# Patient Record
Sex: Male | Born: 1980 | Race: White | Hispanic: No | Marital: Single | Smoking: Current every day smoker
Health system: Southern US, Community
[De-identification: ages and names within clinical notes are randomized; demographics above are authoritative.]

## PROBLEM LIST (undated history)

## (undated) DIAGNOSIS — N200 Calculus of kidney: Secondary | ICD-10-CM

## (undated) DIAGNOSIS — R569 Unspecified convulsions: Secondary | ICD-10-CM

## (undated) DIAGNOSIS — F4310 Post-traumatic stress disorder, unspecified: Secondary | ICD-10-CM

## (undated) HISTORY — PX: TONSILLECTOMY: SUR1361

---

## 2022-01-31 ENCOUNTER — Emergency Department (HOSPITAL_COMMUNITY)
Admission: EM | Admit: 2022-01-31 | Discharge: 2022-01-31 | Disposition: A | Discharge status: Home / Self Care | Payer: No Typology Code available for payment source | Attending: Emergency Medicine | Admitting: Emergency Medicine

## 2022-01-31 ENCOUNTER — Emergency Department (HOSPITAL_COMMUNITY): Payer: No Typology Code available for payment source

## 2022-01-31 ENCOUNTER — Encounter (HOSPITAL_COMMUNITY): Payer: Self-pay | Admitting: Emergency Medicine

## 2022-01-31 DIAGNOSIS — D72829 Elevated white blood cell count, unspecified: Secondary | ICD-10-CM | POA: Insufficient documentation

## 2022-01-31 DIAGNOSIS — N1330 Unspecified hydronephrosis: Secondary | ICD-10-CM | POA: Diagnosis not present

## 2022-01-31 DIAGNOSIS — R1032 Left lower quadrant pain: Secondary | ICD-10-CM | POA: Diagnosis present

## 2022-01-31 DIAGNOSIS — N200 Calculus of kidney: Secondary | ICD-10-CM

## 2022-01-31 HISTORY — DX: Calculus of kidney: N20.0

## 2022-01-31 LAB — URINALYSIS, ROUTINE W REFLEX MICROSCOPIC
Bilirubin Urine: NEGATIVE
Glucose, UA: NEGATIVE mg/dL
Ketones, ur: NEGATIVE mg/dL
Leukocytes,Ua: NEGATIVE
Nitrite: NEGATIVE
Protein, ur: NEGATIVE mg/dL
RBC / HPF: >50 RBC/hpf — ABNORMAL HIGH (ref 0–5)
Specific Gravity, Urine: 1.013 (ref 1.005–1.030)
pH: 6 (ref 5.0–8.0)

## 2022-01-31 LAB — COMPREHENSIVE METABOLIC PANEL
ALT: 29 U/L (ref 0–44)
AST: 31 U/L (ref 15–41)
Albumin: 4.7 g/dL (ref 3.5–5.0)
Alkaline Phosphatase: 66 U/L (ref 38–126)
Anion gap: 11 (ref 5–15)
BUN: 14 mg/dL (ref 6–20)
CO2: 20 mmol/L — ABNORMAL LOW (ref 22–32)
Calcium: 9.7 mg/dL (ref 8.9–10.3)
Chloride: 108 mmol/L (ref 98–111)
Creatinine, Ser: 1.36 mg/dL — ABNORMAL HIGH (ref 0.61–1.24)
GFR, Estimated: >60 mL/min (ref >60)
Glucose, Bld: 106 mg/dL — ABNORMAL HIGH (ref 70–99)
Potassium: 3.8 mmol/L (ref 3.5–5.1)
Sodium: 139 mmol/L (ref 135–145)
Total Bilirubin: 0.7 mg/dL (ref 0.3–1.2)
Total Protein: 8.2 g/dL — ABNORMAL HIGH (ref 6.5–8.1)

## 2022-01-31 LAB — CBC
HCT: 48.7 % (ref 39.0–52.0)
Hemoglobin: 16.2 g/dL (ref 13.0–17.0)
MCH: 29.9 pg (ref 26.0–34.0)
MCHC: 33.3 g/dL (ref 30.0–36.0)
MCV: 89.9 fL (ref 80.0–100.0)
Platelets: 310 10*3/uL (ref 150–400)
RBC: 5.42 MIL/uL (ref 4.22–5.81)
RDW: 12.7 % (ref 11.5–15.5)
WBC: 11.8 10*3/uL — ABNORMAL HIGH (ref 4.0–10.5)
nRBC: 0 % (ref 0.0–0.2)

## 2022-01-31 LAB — LIPASE, BLOOD: Lipase: 39 U/L (ref 11–51)

## 2022-01-31 MED ORDER — TAMSULOSIN HCL 0.4 MG PO CAPS: 0.4000 mg | ORAL_CAPSULE | Freq: Every day | ORAL | 0 refills | Status: DC

## 2022-01-31 MED ORDER — MORPHINE SULFATE (PF) 4 MG/ML IV SOLN
4.0000 mg | Freq: Once | INTRAVENOUS | Status: AC
  Administered 2022-01-31: 4 mg via INTRAVENOUS
  Filled 2022-01-31: qty 1

## 2022-01-31 MED ORDER — KETOROLAC TROMETHAMINE 30 MG/ML IJ SOLN
30.0000 mg | Freq: Once | INTRAMUSCULAR | Status: AC
  Administered 2022-01-31: 30 mg via INTRAVENOUS
  Filled 2022-01-31: qty 1

## 2022-01-31 MED ORDER — HYDROCODONE-ACETAMINOPHEN 5-325 MG PO TABS: 1.0000 | ORAL_TABLET | Freq: Four times a day (QID) | ORAL | 0 refills | Status: AC | PRN

## 2022-01-31 MED ORDER — ONDANSETRON HCL 4 MG PO TABS: 4.0000 mg | ORAL_TABLET | Freq: Three times a day (TID) | ORAL | 0 refills | Status: DC | PRN

## 2022-01-31 MED ORDER — SODIUM CHLORIDE 0.9 % IV BOLUS
1000.0000 mL | Freq: Once | INTRAVENOUS | Status: AC
  Administered 2022-01-31: 1000 mL via INTRAVENOUS

## 2022-01-31 NOTE — ED Triage Notes (Signed)
Patient here with complaint of stabbing left flank pain that started at approximately 1630 today. Patient reports history of kidney stones, states this feels different from previous stones. Patient is alert and oriented at this time. ?

## 2022-01-31 NOTE — Discharge Instructions (Addendum)
You have been seen and discharged from the emergency department.  Your CAT scan showed a 3 mm kidney stone down of the bladder on the left.  No signs of kidney dysfunction/urinary tract infection.  Take medications as prescribed.  Take ibuprofen for pain control, stay well-hydrated.  Use stronger pain medicine as needed.  Do not mix this medication with alcohol or other sedating medications. Do not drive or do heavy physical activity until you know how this medication affects you.  It may cause drowsiness.  Follow-up with your primary provider for further evaluation and further care. Take home medications as prescribed. If you have any worsening symptoms or further concerns for your health please return to an emergency department for further evaluation. ?

## 2022-01-31 NOTE — ED Provider Notes (Signed)
?[facility] EMERGENCY DEPARTMENT ?Provider Note ? ? ?CSN: [id number] ?Arrival date & time: 01/31/22  1647 ? ?  ? ?History ? ?Chief Complaint  ?Patient presents with  ? Flank Pain  ? ? ?Kenneth Washington is a 41 y.o. male. ? ?HPI ? ?41 year old male with past medical history of kidney stones presents emergency department with sharp left lower quadrant pain.  Patient states that this started about 30 minutes prior to arrival.  The pain is located in the left lower quadrant, does not radiate anywhere.  Denies any dysuria, hematuria but did experience some urinary frequency earlier.  Denies any fever.  He had mild nausea secondary to pain exacerbation but denies any vomiting or diarrhea. ? ?Home Medications ?Prior to Admission medications   ?Not on File  ?   ? ?Allergies    ?Patient has no known allergies.   ? ?Review of Systems   ?Review of Systems  ?Constitutional:  Negative for fever.  ?Respiratory:  Negative for shortness of breath.   ?Cardiovascular:  Negative for chest pain.  ?Gastrointestinal:  Positive for abdominal pain. Negative for diarrhea and vomiting.  ?Genitourinary:  Positive for frequency. Negative for difficulty urinating, dysuria, flank pain and hematuria.  ?Skin:  Negative for rash.  ?Neurological:  Negative for headaches.  ? ?Physical Exam ?Updated Vital Signs ?BP (!) 140/94 (BP Location: Left Arm)   Pulse 80   Temp 97.7 ?F (36.5 ?C) (Oral)   Resp 16   SpO2 99%  ?Physical Exam ?Vitals and nursing note reviewed.  ?Constitutional:   ?   Appearance: Normal appearance.  ?HENT:  ?   Head: Normocephalic.  ?   Mouth/Throat:  ?   Mouth: Mucous membranes are moist.  ?Cardiovascular:  ?   Rate and Rhythm: Normal rate.  ?Pulmonary:  ?   Effort: Pulmonary effort is normal. No respiratory distress.  ?Abdominal:  ?   Palpations: Abdomen is soft.  ?   Tenderness: There is abdominal tenderness. There is no guarding or rebound.  ?Skin: ?   General: Skin is warm.  ?Neurological:  ?   Mental Status:  He is alert and oriented to person, place, and time. Mental status is at baseline.  ?Psychiatric:     ?   Mood and Affect: Mood normal.  ? ? ?ED Results / Procedures / Treatments   ?Labs ?(all labs ordered are listed, but only abnormal results are displayed) ?Labs Reviewed  ?COMPREHENSIVE METABOLIC PANEL - Abnormal; Notable for the following components:  ?    Result Value  ? CO2 20 (*)   ? Glucose, Bld 106 (*)   ? Creatinine, Ser 1.36 (*)   ? Total Protein 8.2 (*)   ? All other components within normal limits  ?CBC - Abnormal; Notable for the following components:  ? WBC 11.8 (*)   ? All other components within normal limits  ?URINALYSIS, ROUTINE W REFLEX MICROSCOPIC - Abnormal; Notable for the following components:  ? Hgb urine dipstick LARGE (*)   ? RBC / HPF >50 (*)   ? Bacteria, UA RARE (*)   ? All other components within normal limits  ?LIPASE, BLOOD  ? ? ?EKG ?None ? ?Radiology ?CT Renal Stone Study ? ?Result Date: 01/31/2022 ?CLINICAL DATA:  Left flank pain EXAM: CT ABDOMEN AND PELVIS WITHOUT CONTRAST TECHNIQUE: Multidetector CT imaging of the abdomen and pelvis was performed following the standard protocol without IV contrast. RADIATION DOSE REDUCTION: This exam was performed according to the departmental dose-optimization program  which includes automated exposure control, adjustment of the mA and/or kV according to patient size and/or use of iterative reconstruction technique. COMPARISON:  None. FINDINGS: Lower chest: Focal pulmonary opacity or pleural effusion. Hepatobiliary: No focal hepatic lesion. Large gallstone in the dependent aspect of the gallbladder, which is otherwise unremarkable. No gallbladder wall thickening or pericholecystic fluid. Pancreas: Unremarkable. No pancreatic ductal dilatation or surrounding inflammatory changes. Spleen: Normal in size without focal abnormality. Adrenals/Urinary Tract: The adrenal glands are unremarkable. Mild left hydronephrosis, with mild dilatation of the left  ureter throughout its course, with mild left periureteric stranding. At the left UVJ, there is a 3 mm stone (series 3, image 78), which may be lodged at the UVJ; supine positioning limits evaluation for impaction. The bladder is otherwise unremarkable. The right kidney and ureter are unremarkable. Stomach/Bowel: Stomach is within normal limits. No evidence of bowel wall thickening, distention, or inflammatory changes. Vascular/Lymphatic: No significant vascular findings are present. No enlarged abdominal or pelvic lymph nodes. Reproductive: Prostate is unremarkable. Other: No free fluid or free air in the abdomen or pelvis. Musculoskeletal: No acute osseous abnormality. Mild S shaped curvature of the thoracolumbar spine. IMPRESSION: 1. 3 mm stone at the left UVJ, with mild ureterectasis and hydronephrosis, as well as periureteric stranding, which is favored to be impacted at the UVJ and obstructive. 2. No other acute process in the abdomen or pelvis. Electronically Signed   By: Wiliam Ke M.D.   On: 01/31/2022 19:15   ? ?Procedures ?Procedures  ? ? ?Medications Ordered in ED ?Medications  ?sodium chloride 0.9 % bolus 1,000 mL (0 mLs Intravenous Stopped 01/31/22 1955)  ?ketorolac (TORADOL) 30 MG/ML injection 30 mg (30 mg Intravenous Given 01/31/22 1818)  ?morphine (PF) 4 MG/ML injection 4 mg (4 mg Intravenous Given 01/31/22 1929)  ? ? ?ED Course/ Medical Decision Making/ A&P ?  ?                        ?Medical Decision Making ?Risk ?Prescription drug management. ? ? ?41 year old male presents emergency department left lower quadrant abdominal pain.  Vitals are stable on arrival, he has mild tenderness to palpation of the left lower quadrant, denies any flank pain.  Endorses urinary frequency without dysuria/hematuria.  No fever. ? ?Blood work is reassuring, kidney function is normal.  Mild leukocytosis.  CT of the abdomen pelvis shows a 3 mm stone at the UVJ with some mild.  Ureter stranding.  No hydronephrosis.   Urinalysis shows no acute urinary tract infection.  After IV medications, hydration patient feels improved, is able to tolerate p.o. ? ?Plan for outpatient treatment of kidney stone and urology follow-up.  Patient at this time appears safe and stable for discharge and close outpatient follow up. Discharge plan and strict return to ED precautions discussed, patient verbalizes understanding and agreement. ? ? ? ? ? ? ? ?Final Clinical Impression(s) / ED Diagnoses ?Final diagnoses:  ?None  ? ? ?Rx / DC Orders ?ED Discharge Orders   ? ? None  ? ?  ? ? ?  ?Rozelle Logan, DO ?01/31/22 2034 ? ?

## 2022-01-31 NOTE — ED Provider Triage Note (Signed)
Emergency Medicine Provider Triage Evaluation Note ? ?Kenneth Washington , a 41 y.o. male  was evaluated in triage.  Pt complains of left flank pain starting 30 minutes ago. He describes it as a stabbing pain that radiates into his LLQ. He tried using the bathroom and had no change in his symptoms.  ? ?Review of Systems  ?Positive: Abdominal pain, flank pain ?Negative: Fever, vomiting, urinary sx, diarrhea, constipation ? ?Physical Exam  ?BP (!) 139/101   Pulse 71   Temp 97.7 ?F (36.5 ?C) (Oral)   Resp 16   SpO2 100%  ?Gen:   Awake, no distress   ?Resp:  Normal effort  ?MSK:   Moves extremities without difficulty  ?Other:  Patient doubled over holding his side in triage ? ?Medical Decision Making  ?Medically screening exam initiated at 4:57 PM.  Appropriate orders placed.  Kenneth Washington was informed that the remainder of the evaluation will be completed by another provider, this initial triage assessment does not replace that evaluation, and the importance of remaining in the ED until their evaluation is complete. ? ? ?  ?Su Monks, PA-C ?01/31/22 1657 ? ?

## 2022-04-03 ENCOUNTER — Other Ambulatory Visit: Payer: Self-pay

## 2022-04-03 ENCOUNTER — Emergency Department (HOSPITAL_COMMUNITY)
Admission: EM | Admit: 2022-04-03 | Discharge: 2022-04-03 | Disposition: A | Discharge status: Home / Self Care | Payer: No Typology Code available for payment source | Attending: Emergency Medicine | Admitting: Emergency Medicine

## 2022-04-03 DIAGNOSIS — R519 Headache, unspecified: Secondary | ICD-10-CM

## 2022-04-03 MED ORDER — PROCHLORPERAZINE EDISYLATE 10 MG/2ML IJ SOLN
10.0000 mg | Freq: Once | INTRAMUSCULAR | Status: AC
  Administered 2022-04-03: 10 mg via INTRAVENOUS
  Filled 2022-04-03: qty 2

## 2022-04-03 MED ORDER — DEXAMETHASONE SODIUM PHOSPHATE 10 MG/ML IJ SOLN
10.0000 mg | Freq: Once | INTRAMUSCULAR | Status: AC
  Administered 2022-04-03: 10 mg via INTRAVENOUS
  Filled 2022-04-03: qty 1

## 2022-04-03 MED ORDER — KETOROLAC TROMETHAMINE 30 MG/ML IJ SOLN
30.0000 mg | Freq: Once | INTRAMUSCULAR | Status: AC
  Administered 2022-04-03: 30 mg via INTRAMUSCULAR
  Filled 2022-04-03: qty 1

## 2022-04-03 MED ORDER — SODIUM CHLORIDE 0.9 % IV BOLUS
1000.0000 mL | Freq: Once | INTRAVENOUS | Status: AC
  Administered 2022-04-03: 1000 mL via INTRAVENOUS

## 2022-04-03 NOTE — ED Notes (Signed)
Patient does complain of photophobia, denies any noise sensitivity.

## 2022-04-03 NOTE — ED Provider Notes (Signed)
[facility] EMERGENCY DEPARTMENT Provider Note   CSN: [id number] Arrival date & time: 04/03/22  1656     History  Chief Complaint  Patient presents with   Migraine    Kenneth Washington is a 41 y.o. male.  HPI Patient presents with his wife who assists with the history. Patient presents due to headache for 3 days.  Has history of migraines, states that this is classically in the same distribution without new features including weakness in his extremities, incontinence, confusion, speech difficulty. In spite of using sumatriptan he has had no relief.    Home Medications Prior to Admission medications   Medication Sig Start Date End Date Taking? Authorizing Provider  ondansetron (ZOFRAN) 4 MG tablet Take 1 tablet (4 mg total) by mouth every 8 (eight) hours as needed for nausea or vomiting. 01/31/22   Horton, Clabe Seal, DO  tamsulosin (FLOMAX) 0.4 MG CAPS capsule Take 1 capsule (0.4 mg total) by mouth daily. 01/31/22   Horton, Clabe Seal, DO      Allergies    Patient has no known allergies.    Review of Systems   Review of Systems  All other systems reviewed and are negative.  Physical Exam Updated Vital Signs BP 118/87   Pulse 72   Temp (!) 97.4 F (36.3 C)   Resp 16   SpO2 97%  Physical Exam Vitals and nursing note reviewed.  Constitutional:      General: He is not in acute distress.    Appearance: He is well-developed.  HENT:     Head: Normocephalic and atraumatic.  Eyes:     Conjunctiva/sclera: Conjunctivae normal.  Pulmonary:     Effort: Pulmonary effort is normal. No respiratory distress.     Breath sounds: No stridor.  Abdominal:     General: There is no distension.  Skin:    General: Skin is warm and dry.  Neurological:     General: No focal deficit present.     Mental Status: He is alert and oriented to person, place, and time.  Psychiatric:        Mood and Affect: Mood normal.        Behavior: Behavior normal.    ED Results /  Procedures / Treatments   Labs (all labs ordered are listed, but only abnormal results are displayed) Labs Reviewed - No data to display  EKG None  Radiology No results found.  Procedures Procedures    Medications Ordered in ED Medications  ketorolac (TORADOL) 30 MG/ML injection 30 mg (30 mg Intramuscular Given 04/03/22 1830)  sodium chloride 0.9 % bolus 1,000 mL (0 mLs Intravenous Stopped 04/03/22 2144)  prochlorperazine (COMPAZINE) injection 10 mg (10 mg Intravenous Given 04/03/22 2046)  dexamethasone (DECADRON) injection 10 mg (10 mg Intravenous Given 04/03/22 2046)    ED Course/ Medical Decision Making/ A&P This patient with a Hx of headaches presents to the ED for concern of headache refractory to home meds, this involves an extensive number of treatment options, and is a complaint that carries with it a high risk of complications and morbidity.    The differential diagnosis includes bad headache, new intracranial abnormality CNS dysfunction    Social Determinants of Health:  No limits  Additional history obtained:  Additional history and/or information obtained from wife, notable for HPI   After the initial evaluation, orders, including: IV fluids, Compazine, Toradol, Decadron were initiated.   Patient placed on Cardiac and Pulse-Oximetry Monitors. The patient was maintained on  a cardiac monitor.  The cardiac monitored showed an rhythm of 70 sinus normal The patient was also maintained on pulse oximetry. The readings were typically 100% room air normal   On repeat evaluation of the patient improved Dispostion / Final MDM:  After consideration of the diagnostic results and the patient's response to treatment, with resolution of his pain following IV fluids, Compazine, Toradol, Decadron, no new neuro complaints, the patient's acknowledgment of the headache being typical for his migraines, low suspicion for new CNS pathology, no evidence for stroke.  Patient  comfortable with discharge.  Final Clinical Impression(s) / ED Diagnoses Final diagnoses:  Bad headache    Rx / DC Orders ED Discharge Orders     None         Gerhard Munch, MD 04/03/22 2157

## 2022-04-03 NOTE — ED Triage Notes (Signed)
Pt here from home for Migraine. Pt reported first had a HA 2 days ago and progressed into migraine today when he woke up. Pt has had photphobia, nausea, and feels like "needles are going into my eyes". Pt has taken his dose of sumatriptan w/o relief.

## 2022-04-03 NOTE — ED Provider Triage Note (Signed)
Emergency Medicine Provider Triage Evaluation Note  Kenneth Washington , a 41 y.o. male  was evaluated in triage.  Pt complains of migraine.  Patient states he has had a migraine with waxing and waning symptoms over the past 3 days.  He woke up this morning with much more severe pain and feels like it is stabbing behind his eyes.  He states the pain at this time is on both sides of his head.  He does endorse photophobia.  Denies aura prior to onset.  States he took a sumatriptan this morning but has had no relief.  Patient does endorse previously getting a Toradol injection which helped relieve symptoms  Review of Systems  Positive: Headache Negative: Shortness of breath  Physical Exam  BP (!) 133/91 (BP Location: Right Arm)   Pulse 74   Temp 98 F (36.7 C) (Oral)   Resp 17   SpO2 97%  Gen:   Awake, patient wearing sunglasses, in the dark, appears uncomfortable Resp:  Normal effort  MSK:   Moves extremities without difficulty  Other:    Medical Decision Making  Medically screening exam initiated at 6:23 PM.  Appropriate orders placed.  Kenneth Washington was informed that the remainder of the evaluation will be completed by another provider, this initial triage assessment does not replace that evaluation, and the importance of remaining in the ED until their evaluation is complete.     Kenneth Grinder, PA-C 04/03/22 1824

## 2022-04-03 NOTE — Discharge Instructions (Signed)
As discussed, your evaluation today has been largely reassuring.  But, it is important that you monitor your condition carefully, and do not hesitate to return to the ED if you develop new, or concerning changes in your condition. ? ?Otherwise, please follow-up with your physician for appropriate ongoing care. ? ?

## 2022-12-02 IMAGING — CT CT RENAL STONE PROTOCOL
2 of 4 series · 16 of 46 positions shown, 18 images · non-contrast
Comparison: None.

CLINICAL DATA: Left flank pain



[Series 3: stone study 5.0 i30f 2 · axial · 0.83mm/px · z∈[-690,-255]mm · 13 of 95 slices shown, 15 images]
[im 4/95  soft-tissue]
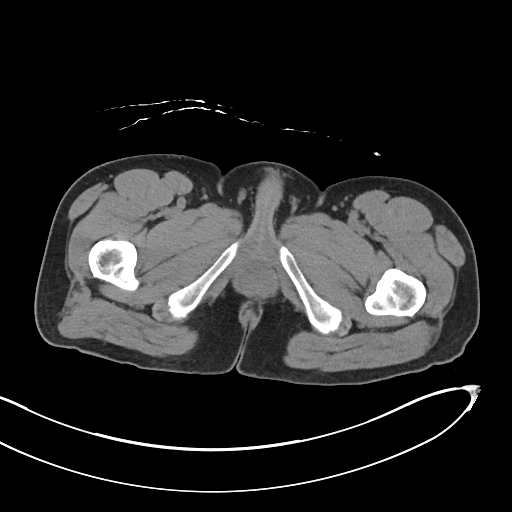
[im 4/95  bone]
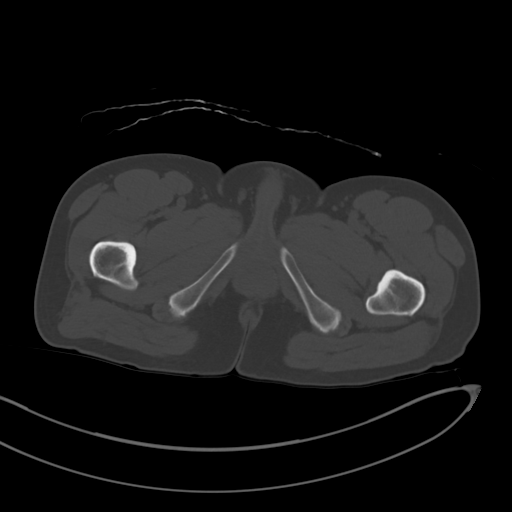
[im 12/95  soft-tissue]
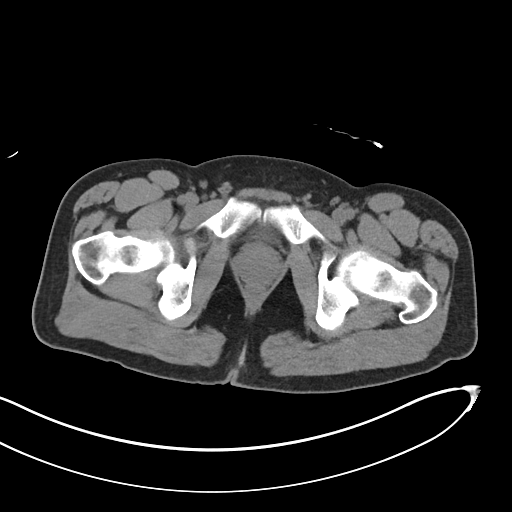
[im 20/95  soft-tissue]
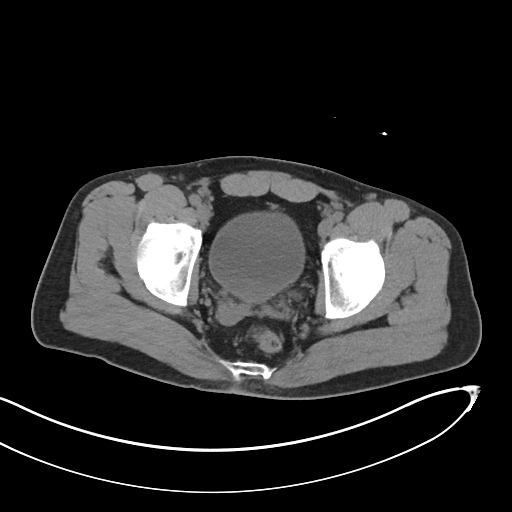
[im 28/95  soft-tissue]
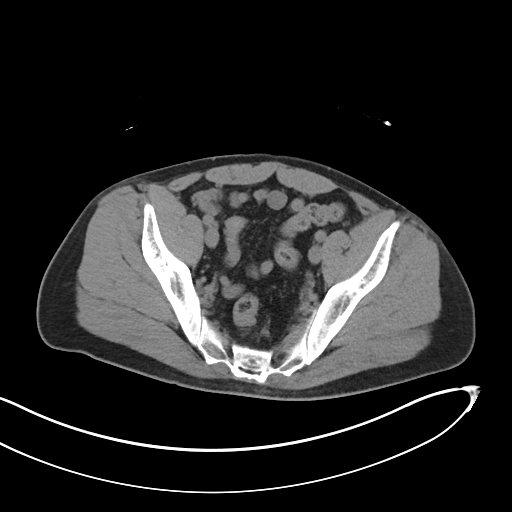
[im 32/95  soft-tissue]
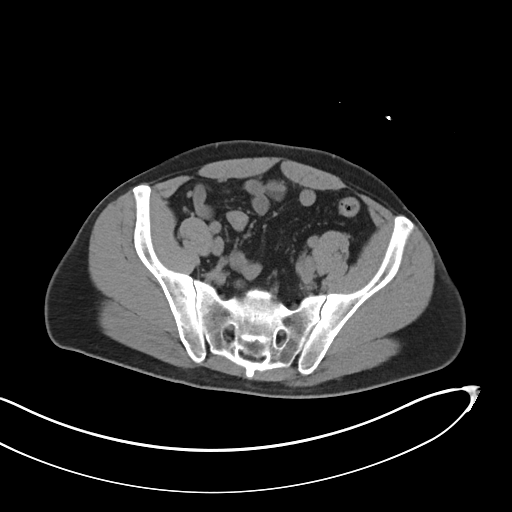
[im 40/95  soft-tissue]
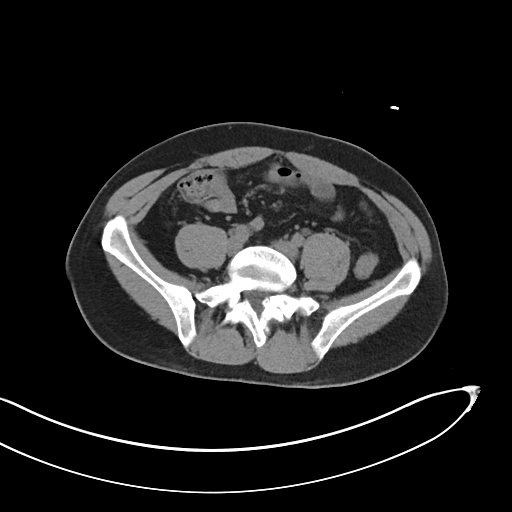
[im 48/95  soft-tissue]
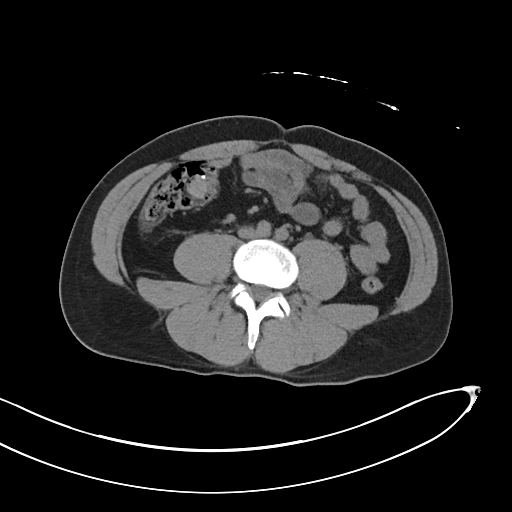
[im 55/95  soft-tissue]
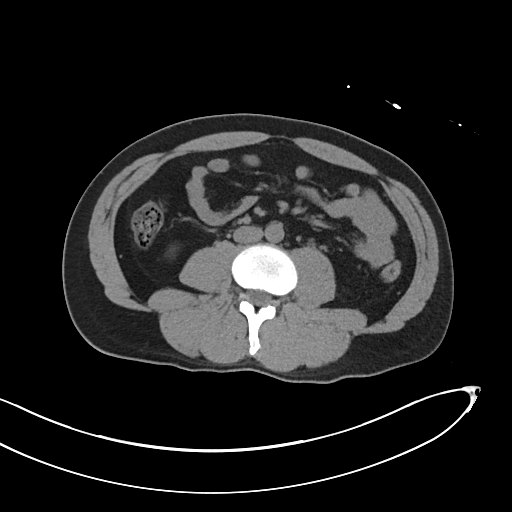
[im 63/95  soft-tissue]
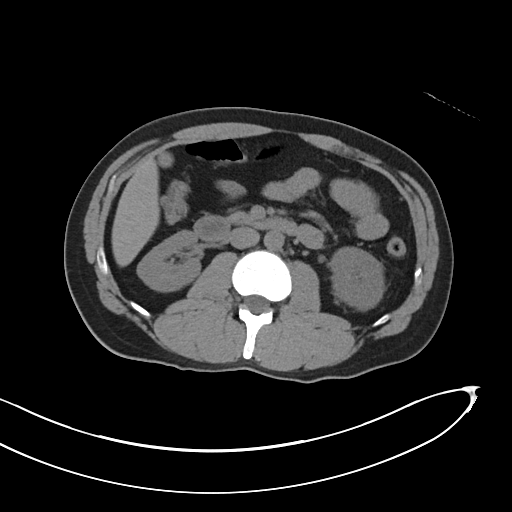
[im 63/95  bone]
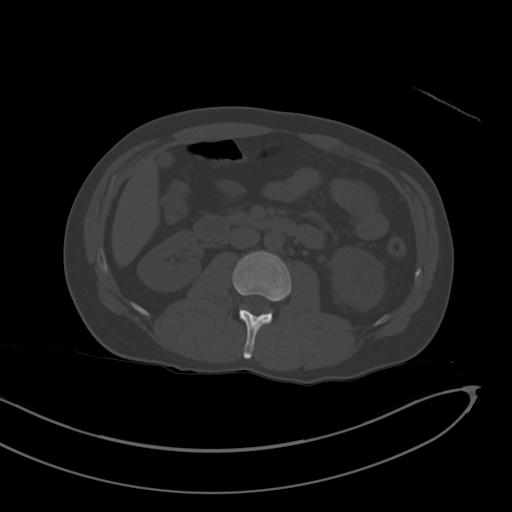
[im 67/95  soft-tissue]
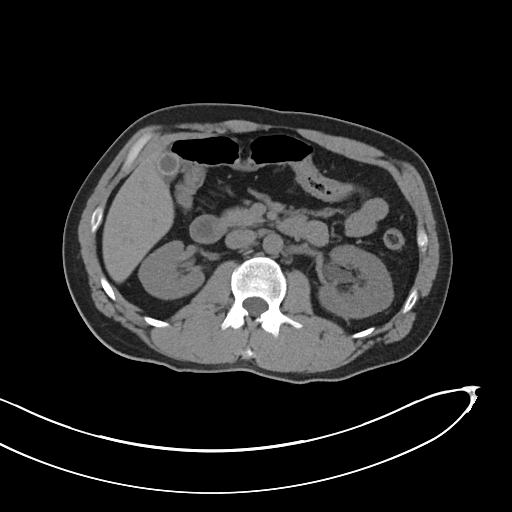
[im 75/95  soft-tissue]
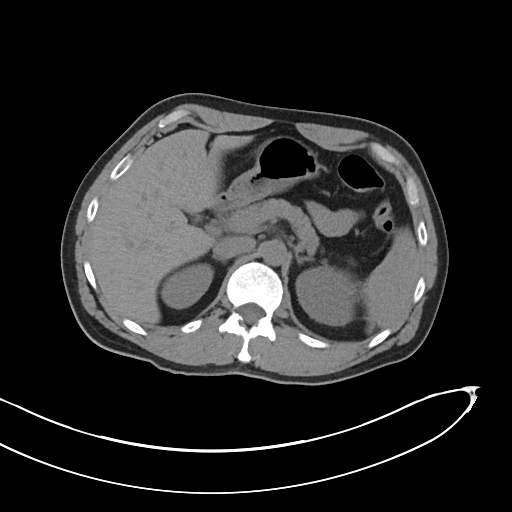
[im 83/95  soft-tissue]
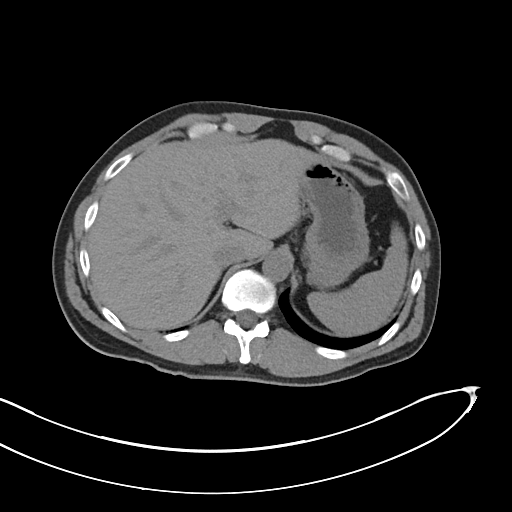
[im 91/95  soft-tissue]
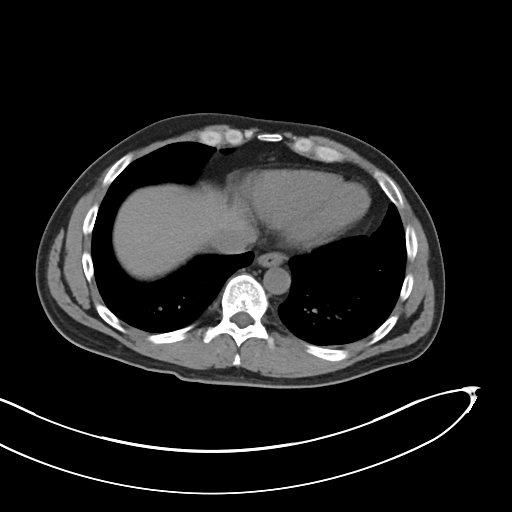

[Series 6: coronal soft tissue · coronal · 0.79mm/px · 3 of 99 slices shown]
[im 33/99  soft-tissue]
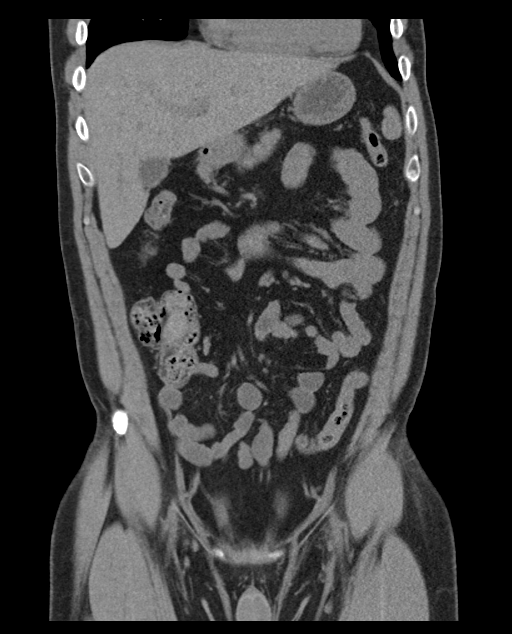
[im 44/99  soft-tissue]
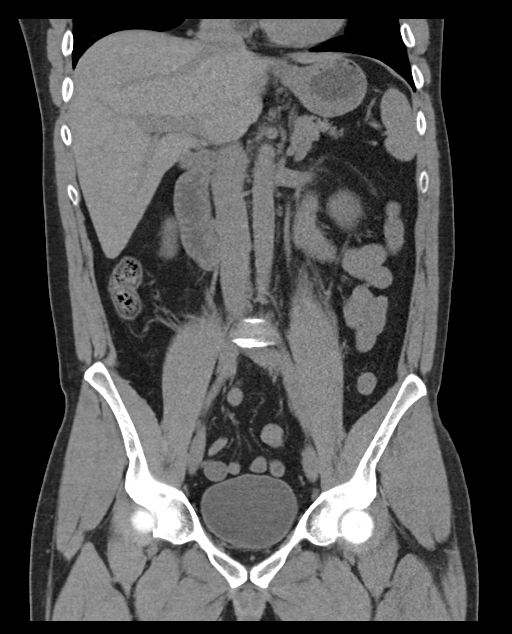
[im 55/99  soft-tissue]
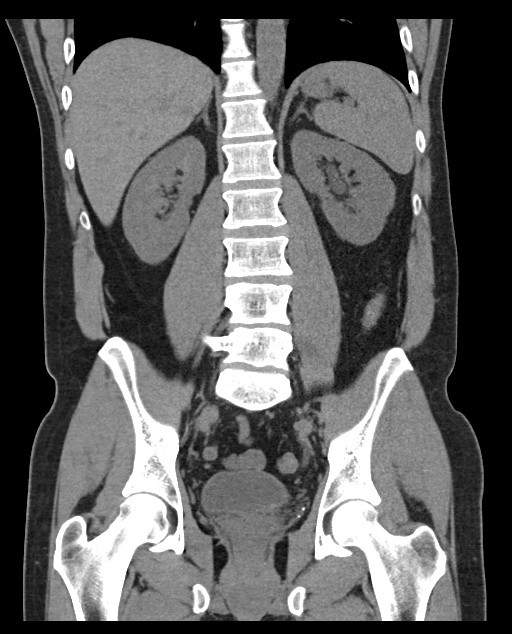

[16 of 46 positions shown; findings below may reference images not displayed]

FINDINGS: Lower chest: Focal pulmonary opacity or pleural effusion.

Hepatobiliary: No focal hepatic lesion. Large gallstone in the
dependent aspect of the gallbladder, which is otherwise
unremarkable. No gallbladder wall thickening or pericholecystic
fluid.

Pancreas: Unremarkable. No pancreatic ductal dilatation or
surrounding inflammatory changes.

Spleen: Normal in size without focal abnormality.

Adrenals/Urinary Tract: The adrenal glands are unremarkable. Mild
left hydronephrosis, with mild dilatation of the left ureter
throughout its course, with mild left periureteric stranding. At the
left UVJ, there is a 3 mm stone (series 3, image 78), which may be
lodged at the UVJ; supine positioning limits evaluation for
impaction. The bladder is otherwise unremarkable. The right kidney
and ureter are unremarkable.

Stomach/Bowel: Stomach is within normal limits. No evidence of bowel
wall thickening, distention, or inflammatory changes.

Vascular/Lymphatic: No significant vascular findings are present. No
enlarged abdominal or pelvic lymph nodes.

Reproductive: Prostate is unremarkable.

Other: No free fluid or free air in the abdomen or pelvis.

Musculoskeletal: No acute osseous abnormality. Mild S shaped
curvature of the thoracolumbar spine.
IMPRESSION: 1. 3 mm stone at the left UVJ, with mild ureterectasis and
hydronephrosis, as well as periureteric stranding, which is favored
to be impacted at the UVJ and obstructive.
2. No other acute process in the abdomen or pelvis.

## 2022-12-10 ENCOUNTER — Other Ambulatory Visit: Payer: Self-pay

## 2022-12-10 ENCOUNTER — Emergency Department (HOSPITAL_COMMUNITY): Payer: No Typology Code available for payment source

## 2022-12-10 ENCOUNTER — Emergency Department (HOSPITAL_COMMUNITY)
Admission: EM | Admit: 2022-12-10 | Discharge: 2022-12-10 | Disposition: A | Discharge status: Home / Self Care | Payer: No Typology Code available for payment source | Attending: Emergency Medicine | Admitting: Emergency Medicine

## 2022-12-10 ENCOUNTER — Encounter (HOSPITAL_COMMUNITY): Payer: Self-pay

## 2022-12-10 DIAGNOSIS — M545 Low back pain, unspecified: Secondary | ICD-10-CM | POA: Diagnosis present

## 2022-12-10 DIAGNOSIS — M5441 Lumbago with sciatica, right side: Secondary | ICD-10-CM | POA: Diagnosis not present

## 2022-12-10 DIAGNOSIS — M5442 Lumbago with sciatica, left side: Secondary | ICD-10-CM | POA: Diagnosis not present

## 2022-12-10 MED ORDER — KETOROLAC TROMETHAMINE 60 MG/2ML IM SOLN
30.0000 mg | Freq: Once | INTRAMUSCULAR | Status: AC
  Administered 2022-12-10: 30 mg via INTRAMUSCULAR
  Filled 2022-12-10: qty 2

## 2022-12-10 MED ORDER — OXYCODONE-ACETAMINOPHEN 5-325 MG PO TABS: 1.0000 | ORAL_TABLET | Freq: Four times a day (QID) | ORAL | 0 refills | Status: AC | PRN

## 2022-12-10 MED ORDER — DEXAMETHASONE SODIUM PHOSPHATE 10 MG/ML IJ SOLN
10.0000 mg | Freq: Once | INTRAMUSCULAR | Status: AC
  Administered 2022-12-10: 10 mg via INTRAMUSCULAR
  Filled 2022-12-10: qty 1

## 2022-12-10 MED ORDER — LIDOCAINE 5 % EX PTCH
1.0000 | MEDICATED_PATCH | CUTANEOUS | Status: DC
  Administered 2022-12-10: 1 via TRANSDERMAL
  Filled 2022-12-10: qty 1

## 2022-12-10 MED ORDER — OXYCODONE-ACETAMINOPHEN 5-325 MG PO TABS
1.0000 | ORAL_TABLET | Freq: Once | ORAL | Status: AC
  Administered 2022-12-10: 1 via ORAL
  Filled 2022-12-10: qty 1

## 2022-12-10 MED ORDER — PREDNISONE 10 MG (21) PO TBPK
ORAL_TABLET | Freq: Every day | ORAL | 0 refills | Status: DC
Start: 2022-12-10 — End: 2023-01-03

## 2022-12-10 MED ORDER — LIDOCAINE 5 % EX PTCH: 1.0000 | MEDICATED_PATCH | CUTANEOUS | 0 refills | Status: AC

## 2022-12-10 NOTE — ED Notes (Addendum)
Pt alert and ambulatory with steady gait at time of departure. Driven home by pt mother

## 2022-12-10 NOTE — Discharge Instructions (Signed)
Continue Tylenol and ibuprofen.  Continue muscle relaxer as needed.  Prescriptions were sent to your pharmacy for a steroid taper as well as lidocaine patches.  Take the steroid taper as prescribed.  Place 1 lidocaine patch per day as needed.  A few doses of narcotic pain medication were sent to your pharmacy as well.  Take these only as needed.  Follow-up with your neurosurgeon as soon as possible.

## 2022-12-10 NOTE — ED Triage Notes (Signed)
Pt c/o lower back painx3d. Pt denies recent injury. Pt states "it feels like someone is pouring hot water over my skin" and radiating heat from spine. Pt c/o numbness and tingling mid lower to right lower back. Pt was able to walk with cane to triage. Pt denies loss of bowel or bladder.

## 2022-12-10 NOTE — ED Notes (Signed)
Pt transported to XR.  

## 2022-12-10 NOTE — ED Provider Notes (Signed)
[facility] Provider Note   CSN: [id number] Arrival date & time: 12/10/22  7867     History  Chief Complaint  Patient presents with   Back Pain    Kenneth Washington is a 42 y.o. male.   Back Pain Patient presents for lower back pain.  Medical history includes prior nephrolithiasis and chronic low back pain.  He is followed by [facility] neurosurgery.  He was last seen in the office 1.5 years ago.  Per chart review, chronic low back pain with independent radiations to RLE began 10 years ago during a an IED injury in the [employer].  He was previously receiving epidural injections.  He has not had any other procedures on his lower back.  2 days ago, he had sudden onset of severe lower back pain.  This occurred as he was standing up from a recliner.  He has had persistent pain since that time.  He will have spasms of pain at times with movements.  Pain will sometimes radiate to his lower legs which is a chronic condition for him.  He states that he has chronic lateral right thigh numbness.  He denies any new areas of numbness.  He denies any areas of weakness.  He has not had any difficulty with urination or defecation.  His pain at home with Tylenol, ibuprofen, and tizanidine.  He has a long history of back pain and has tried other muscle relaxers in the past.  He states that he developed a tolerance to Flexeril which does not work well for him.  He states that Robaxin makes him "mean".  Currently, he does not take any narcotic pain medication but has in the past.    Home Medications Prior to Admission medications   Medication Sig Start Date End Date Taking? Authorizing Provider  lidocaine (LIDODERM) 5 % Place 1 patch onto the skin daily for 7 days. Remove & Discard patch within 12 hours or as directed by MD 12/10/22 12/17/22 Yes Godfrey Pick, MD  oxyCODONE-acetaminophen (PERCOCET/ROXICET) 5-325 MG tablet Take 1 tablet by mouth every 6 (six) hours as needed for  up to 3 days for severe pain. 12/10/22 12/13/22 Yes Godfrey Pick, MD  predniSONE (STERAPRED UNI-PAK 21 TAB) 10 MG (21) TBPK tablet Take by mouth daily. Take 6 tabs by mouth daily  for 2 days, then 5 tabs for 2 days, then 4 tabs for 2 days, then 3 tabs for 2 days, 2 tabs for 2 days, then 1 tab by mouth daily for 2 days 12/10/22  Yes Godfrey Pick, MD  ondansetron (ZOFRAN) 4 MG tablet Take 1 tablet (4 mg total) by mouth every 8 (eight) hours as needed for nausea or vomiting. 01/31/22   Horton, Alvin Critchley, DO  tamsulosin (FLOMAX) 0.4 MG CAPS capsule Take 1 capsule (0.4 mg total) by mouth daily. 01/31/22   Horton, Alvin Critchley, DO      Allergies    Patient has no known allergies.    Review of Systems   Review of Systems  Musculoskeletal:  Positive for back pain.  All other systems reviewed and are negative.   Physical Exam Updated Vital Signs BP 115/77   Pulse 64   Temp (!) 97.4 F (36.3 C) (Oral)   Resp 17   Ht 5\' 10"  (1.778 m)   Wt 77.1 kg   SpO2 99%   BMI 24.39 kg/m  Physical Exam Vitals and nursing note reviewed.  Constitutional:  General: He is not in acute distress.    Appearance: Normal appearance. He is well-developed. He is not ill-appearing, toxic-appearing or diaphoretic.  HENT:     Head: Normocephalic and atraumatic.     Right Ear: External ear normal.     Left Ear: External ear normal.     Nose: Nose normal.     Mouth/Throat:     Mouth: Mucous membranes are moist.  Eyes:     Extraocular Movements: Extraocular movements intact.     Conjunctiva/sclera: Conjunctivae normal.  Cardiovascular:     Rate and Rhythm: Normal rate and regular rhythm.     Heart sounds: No murmur heard. Pulmonary:     Effort: Pulmonary effort is normal. No respiratory distress.  Abdominal:     General: There is no distension.     Palpations: Abdomen is soft.  Musculoskeletal:        General: Tenderness (L3) present. No swelling or deformity. Normal range of motion.     Cervical back: Normal range  of motion and neck supple.     Right lower leg: No edema.     Left lower leg: No edema.  Skin:    General: Skin is warm and dry.     Capillary Refill: Capillary refill takes less than 2 seconds.     Coloration: Skin is not jaundiced or pale.  Neurological:     General: No focal deficit present.     Mental Status: He is alert and oriented to person, place, and time.     Cranial Nerves: No cranial nerve deficit.     Sensory: No sensory deficit.     Motor: No weakness.     Coordination: Coordination normal.  Psychiatric:        Mood and Affect: Mood normal.        Behavior: Behavior normal.        Thought Content: Thought content normal.        Judgment: Judgment normal.     ED Results / Procedures / Treatments   Labs (all labs ordered are listed, but only abnormal results are displayed) Labs Reviewed - No data to display  EKG None  Radiology DG Lumbar Spine Complete  Result Date: 12/10/2022 CLINICAL DATA:  Low back pain.  No reported injury. EXAM: LUMBAR SPINE - COMPLETE 4+ VIEW COMPARISON:  01/31/2022 CT abdomen/pelvis FINDINGS: This report assumes 5 non rib-bearing lumbar vertebrae. Unilateral right L5 pars defect, as seen on prior CT. Lumbar vertebral body heights are preserved, with no fracture. Mild degenerative disc disease at L4-5. Otherwise preserved lumbar discs. Chronic mild 3 mm retrolisthesis at L4-5. No significant facet arthropathy. No aggressive appearing focal osseous lesions. IMPRESSION: 1. Chronic unilateral right L5 pars defect, as seen on prior CT. 2. Mild degenerative disc disease at L4-5. 3. Chronic mild retrolisthesis at L4-5. Electronically Signed   By: Ilona Sorrel M.D.   On: 12/10/2022 09:21    Procedures Procedures    Medications Ordered in ED Medications  lidocaine (LIDODERM) 5 % 1 patch (1 patch Transdermal Patch Applied 12/10/22 1035)  ketorolac (TORADOL) injection 30 mg (30 mg Intramuscular Given 12/10/22 1035)  oxyCODONE-acetaminophen  (PERCOCET/ROXICET) 5-325 MG per tablet 1 tablet (1 tablet Oral Given 12/10/22 1034)  dexamethasone (DECADRON) injection 10 mg (10 mg Intramuscular Given 12/10/22 1034)    ED Course/ Medical Decision Making/ A&P  Medical Decision Making Amount and/or Complexity of Data Reviewed Radiology: ordered.  Risk Prescription drug management.   Patient is a healthy 42 year old male who presents for acute on chronic low back pain.  Recent exacerbation started 2 days ago as he was standing up from a recliner.  He has been treating his pain at home with Tylenol, ibuprofen, and tizanidine.  He did take tizanidine this morning.  Vital signs are normal on arrival in the ED.  Patient is well-appearing on exam.  He has some tenderness, both midline and paraspinous at the level of L3.  He does not have any saddle anesthesia or lower extremity weakness on exam.  He denies any recent red flag symptoms.  Patient states that his goal is to just be able to manage his pain until he is able to follow-up with his outpatient neurosurgeon.  Patient was given multimodal pain control while in the ED. on reassessment, patient had improved pain.  Prednisone taper was prescribed in addition to lidocaine patches and a few days worth of narcotic pain medication.  Patient to follow-up with his outpatient neurosurgeon.  He was discharged in good condition.        Final Clinical Impression(s) / ED Diagnoses Final diagnoses:  Acute bilateral low back pain with bilateral sciatica    Rx / DC Orders ED Discharge Orders          Ordered    predniSONE (STERAPRED UNI-PAK 21 TAB) 10 MG (21) TBPK tablet  Daily        12/10/22 1115    oxyCODONE-acetaminophen (PERCOCET/ROXICET) 5-325 MG tablet  Every 6 hours PRN        12/10/22 1115    lidocaine (LIDODERM) 5 %  Every 24 hours        12/10/22 1116              Godfrey Pick, MD 12/10/22 1117

## 2023-01-02 ENCOUNTER — Encounter (HOSPITAL_COMMUNITY): Payer: Self-pay | Admitting: Psychiatry

## 2023-01-02 ENCOUNTER — Inpatient Hospital Stay (HOSPITAL_COMMUNITY)
Admission: AD | Admit: 2023-01-02 | Discharge: 2023-01-08 | DRG: 882 | Disposition: A | Discharge status: Home / Self Care | Payer: No Typology Code available for payment source | Source: Intra-hospital | Attending: Psychiatry | Admitting: Psychiatry

## 2023-01-02 ENCOUNTER — Other Ambulatory Visit: Payer: Self-pay

## 2023-01-02 DIAGNOSIS — F17210 Nicotine dependence, cigarettes, uncomplicated: Secondary | ICD-10-CM | POA: Diagnosis present

## 2023-01-02 DIAGNOSIS — R45851 Suicidal ideations: Secondary | ICD-10-CM | POA: Diagnosis present

## 2023-01-02 DIAGNOSIS — Z79899 Other long term (current) drug therapy: Secondary | ICD-10-CM

## 2023-01-02 DIAGNOSIS — E119 Type 2 diabetes mellitus without complications: Secondary | ICD-10-CM | POA: Diagnosis present

## 2023-01-02 DIAGNOSIS — F332 Major depressive disorder, recurrent severe without psychotic features: Secondary | ICD-10-CM | POA: Diagnosis present

## 2023-01-02 DIAGNOSIS — F4322 Adjustment disorder with anxiety: Principal | ICD-10-CM | POA: Diagnosis present

## 2023-01-02 DIAGNOSIS — Z560 Unemployment, unspecified: Secondary | ICD-10-CM

## 2023-01-02 DIAGNOSIS — F4310 Post-traumatic stress disorder, unspecified: Secondary | ICD-10-CM | POA: Diagnosis present

## 2023-01-02 DIAGNOSIS — F419 Anxiety disorder, unspecified: Secondary | ICD-10-CM | POA: Insufficient documentation

## 2023-01-02 HISTORY — DX: Unspecified convulsions: R56.9

## 2023-01-02 HISTORY — DX: Post-traumatic stress disorder, unspecified: F43.10

## 2023-01-02 MED ORDER — DIPHENHYDRAMINE HCL 50 MG/ML IJ SOLN: 50.0000 mg | Freq: Three times a day (TID) | INTRAMUSCULAR | Status: DC | PRN

## 2023-01-02 MED ORDER — NICOTINE POLACRILEX 2 MG MT GUM
2.0000 mg | CHEWING_GUM | OROMUCOSAL | Status: DC | PRN
  Administered 2023-01-03 – 2023-01-07 (×12): 2 mg via ORAL
  Filled 2023-01-02 (×9): qty 1

## 2023-01-02 MED ORDER — LORAZEPAM 2 MG/ML IJ SOLN: 2.0000 mg | Freq: Three times a day (TID) | INTRAMUSCULAR | Status: DC | PRN

## 2023-01-02 MED ORDER — ACETAMINOPHEN 325 MG PO TABS
650.0000 mg | ORAL_TABLET | Freq: Four times a day (QID) | ORAL | Status: DC | PRN
  Administered 2023-01-02 – 2023-01-05 (×7): 650 mg via ORAL
  Filled 2023-01-02 (×7): qty 2

## 2023-01-02 MED ORDER — NICOTINE POLACRILEX 2 MG MT GUM
CHEWING_GUM | OROMUCOSAL | Status: AC
  Filled 2023-01-02: qty 1

## 2023-01-02 MED ORDER — LORAZEPAM 1 MG PO TABS
2.0000 mg | ORAL_TABLET | Freq: Three times a day (TID) | ORAL | Status: DC | PRN
  Administered 2023-01-06: 2 mg via ORAL
  Filled 2023-01-02: qty 2

## 2023-01-02 MED ORDER — HALOPERIDOL LACTATE 5 MG/ML IJ SOLN
5.0000 mg | Freq: Three times a day (TID) | INTRAMUSCULAR | Status: DC | PRN
Start: 2023-01-02 — End: 2023-01-08

## 2023-01-02 MED ORDER — DIPHENHYDRAMINE HCL 25 MG PO CAPS: 50.0000 mg | ORAL_CAPSULE | Freq: Three times a day (TID) | ORAL | Status: DC | PRN

## 2023-01-02 MED ORDER — NICOTINE 14 MG/24HR TD PT24
14.0000 mg | MEDICATED_PATCH | Freq: Every day | TRANSDERMAL | Status: DC
  Filled 2023-01-02: qty 1

## 2023-01-02 MED ORDER — TRAZODONE HCL 50 MG PO TABS
50.0000 mg | ORAL_TABLET | Freq: Every day | ORAL | Status: DC
  Administered 2023-01-02 – 2023-01-07 (×6): 50 mg via ORAL
  Filled 2023-01-02 (×12): qty 1

## 2023-01-02 MED ORDER — MAGNESIUM HYDROXIDE 400 MG/5ML PO SUSP: 30.0000 mL | Freq: Every day | ORAL | Status: DC | PRN

## 2023-01-02 MED ORDER — ALUM & MAG HYDROXIDE-SIMETH 200-200-20 MG/5ML PO SUSP: 30.0000 mL | ORAL | Status: DC | PRN

## 2023-01-02 MED ORDER — HYDROXYZINE HCL 25 MG PO TABS
25.0000 mg | ORAL_TABLET | Freq: Three times a day (TID) | ORAL | Status: DC | PRN
  Administered 2023-01-06: 25 mg via ORAL
  Filled 2023-01-02: qty 1

## 2023-01-02 MED ORDER — HALOPERIDOL 5 MG PO TABS: 5.0000 mg | ORAL_TABLET | Freq: Three times a day (TID) | ORAL | Status: DC | PRN

## 2023-01-02 NOTE — Plan of Care (Signed)
  Problem: Safety: Goal: Periods of time without injury will increase Outcome: Progressing   

## 2023-01-02 NOTE — Progress Notes (Signed)
   01/02/23 2000  Psych Admission Type (Psych Patients Only)  Admission Status Involuntary  Psychosocial Assessment  Patient Complaints Depression  Eye Contact Brief  Facial Expression Sad  Affect Anxious  Speech Logical/coherent  Interaction Assertive  Motor Activity Fidgety  Appearance/Hygiene Unremarkable  Behavior Characteristics Cooperative  Mood Anxious;Depressed;Sad  Thought Process  Coherency WDL  Content Blaming others  Delusions None reported or observed  Perception WDL  Hallucination None reported or observed  Judgment Impaired  Confusion None  Danger to Self  Current suicidal ideation? Denies  Agreement Not to Harm Self Yes  Description of Agreement verbal  Danger to Others  Danger to Others None reported or observed   Alert/oriented. Makes needs/concerns known to staff. Pleasant cooperative with staff. Denies SI/HI/A/V hallucinations. Patient states went to group. Will encourage compliance and progression towards goals. Verbally contracted for safety. Will continue to monitor.
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
## 2023-01-02 NOTE — Plan of Care (Signed)
  Problem: Education: Goal: Knowledge of [place] [facility] Education information/materials will improve Outcome: Progressing Goal: Emotional status will improve Outcome: Progressing Goal: Mental status will improve Outcome: Progressing Goal: Verbalization of understanding the information provided will improve Outcome: Progressing   Problem: Activity: Goal: Interest or engagement in activities will improve Outcome: Progressing Goal: Sleeping patterns will improve Outcome: Progressing   Problem: Coping: Goal: Ability to verbalize frustrations and anger appropriately will improve Outcome: Progressing Goal: Ability to demonstrate self-control will improve Outcome: Progressing   Problem: Health Behavior/Discharge Planning: Goal: Identification of resources available to assist in meeting health care needs will improve Outcome: Progressing Goal: Compliance with treatment plan for underlying cause of condition will improve Outcome: Progressing   Problem: Physical Regulation: Goal: Ability to maintain clinical measurements within normal limits will improve Outcome: Progressing   Problem: Safety: Goal: Periods of time without injury will increase Outcome: Progressing   

## 2023-01-03 ENCOUNTER — Encounter (HOSPITAL_COMMUNITY): Payer: Self-pay

## 2023-01-03 DIAGNOSIS — F419 Anxiety disorder, unspecified: Secondary | ICD-10-CM | POA: Insufficient documentation

## 2023-01-03 DIAGNOSIS — R45851 Suicidal ideations: Secondary | ICD-10-CM

## 2023-01-03 DIAGNOSIS — F332 Major depressive disorder, recurrent severe without psychotic features: Secondary | ICD-10-CM

## 2023-01-03 DIAGNOSIS — F4322 Adjustment disorder with anxiety: Principal | ICD-10-CM | POA: Insufficient documentation

## 2023-01-03 DIAGNOSIS — F4310 Post-traumatic stress disorder, unspecified: Secondary | ICD-10-CM | POA: Insufficient documentation

## 2023-01-03 MED ORDER — PREGABALIN 100 MG PO CAPS
100.0000 mg | ORAL_CAPSULE | Freq: Three times a day (TID) | ORAL | Status: DC
  Administered 2023-01-03 – 2023-01-08 (×15): 100 mg via ORAL
  Filled 2023-01-03 (×15): qty 1

## 2023-01-03 MED ORDER — TIZANIDINE HCL 2 MG PO TABS
4.0000 mg | ORAL_TABLET | Freq: Three times a day (TID) | ORAL | Status: DC | PRN
  Administered 2023-01-04: 4 mg via ORAL
  Filled 2023-01-03: qty 2

## 2023-01-03 MED ORDER — DULOXETINE HCL 30 MG PO CPEP
30.0000 mg | ORAL_CAPSULE | Freq: Every day | ORAL | Status: DC
  Administered 2023-01-03 – 2023-01-05 (×3): 30 mg via ORAL
  Filled 2023-01-03 (×6): qty 1

## 2023-01-03 MED ORDER — PREGABALIN 100 MG PO CAPS: 100.0000 mg | ORAL_CAPSULE | Freq: Two times a day (BID) | ORAL | Status: DC

## 2023-01-03 NOTE — Group Note (Signed)
Date:  01/03/2023 Time:  10:30 PM  Group Topic/Focus:  Wrap-Up Group:   The focus of this group is to help patients review their daily goal of treatment and discuss progress on daily workbooks.    Participation Level:  Active  Participation Quality:  Appropriate  Affect:  Appropriate  Cognitive:  Appropriate  Insight: Appropriate  Engagement in Group:  Engaged  Modes of Intervention:  Discussion and Support  Additional Comments:   Pt attended the Wrap Up/ AA Group.  [place] 01/03/2023, 10:30 PM

## 2023-01-03 NOTE — BH IP Treatment Plan (Signed)
Interdisciplinary Treatment and Diagnostic Plan Update  01/03/2023 Time of Session: [place] MRN: [id number]  Principal Diagnosis: MDD (major depressive disorder), recurrent severe, without psychosis ([place])  Secondary Diagnoses: Principal Problem:   MDD (major depressive disorder), recurrent severe, without psychosis ([place])   Current Medications:  Current Facility-Administered Medications  Medication Dose Route Frequency Provider Last Rate Last Admin   acetaminophen (TYLENOL) tablet 650 mg  650 mg Oral Q6H PRN Onuoha, Chinwendu V, NP   650 mg at 01/03/23 [id number]   alum & mag hydroxide-simeth (MAALOX/MYLANTA) 200-200-20 MG/5ML suspension 30 mL  30 mL Oral Q4H PRN Onuoha, Chinwendu V, NP       diphenhydrAMINE (BENADRYL) capsule 50 mg  50 mg Oral TID PRN Onuoha, Chinwendu V, NP       Or   diphenhydrAMINE (BENADRYL) injection 50 mg  50 mg Intramuscular TID PRN Onuoha, Chinwendu V, NP       haloperidol (HALDOL) tablet 5 mg  5 mg Oral TID PRN Onuoha, Chinwendu V, NP       Or   haloperidol lactate (HALDOL) injection 5 mg  5 mg Intramuscular TID PRN Onuoha, Chinwendu V, NP       hydrOXYzine (ATARAX) tablet 25 mg  25 mg Oral TID PRN Nicholes Rough, NP       LORazepam (ATIVAN) tablet 2 mg  2 mg Oral TID PRN Onuoha, Chinwendu V, NP       Or   LORazepam (ATIVAN) injection 2 mg  2 mg Intramuscular TID PRN Onuoha, Chinwendu V, NP       magnesium hydroxide (MILK OF MAGNESIA) suspension 30 mL  30 mL Oral Daily PRN Onuoha, Chinwendu V, NP       nicotine polacrilex (NICORETTE) gum 2 mg  2 mg Oral PRN Massengill, Ovid Curd, MD   2 mg at 01/03/23 1249   traZODone (DESYREL) tablet 50 mg  50 mg Oral QHS Nicholes Rough, NP   50 mg at 01/02/23 2107   PTA Medications: Medications Prior to Admission  Medication Sig Dispense Refill Last Dose   pregabalin (LYRICA) 100 MG capsule Take 100 mg by mouth 2 (two) times daily. Started at [facility]      tiZANidine (ZANAFLEX) 4 MG tablet Take 4 mg by mouth  every 8 (eight) hours as needed for muscle spasms.   Past Month    Patient Stressors:  Pending Legal concerns  Patient Strengths:  Communication Skills  Treatment Modalities: Medication Management, Group therapy, Case management,  1 to 1 session with clinician, Psychoeducation, Recreational therapy.   Physician Treatment Plan for Primary Diagnosis: MDD (major depressive disorder), recurrent severe, without psychosis ([place]) Long Term Goal(s):     Short Term Goals:  Ability to identify changes in lifestyle to reduce recurrence of condition will improve Ability to verbalize feelings will improve Ability to disclose and discuss suicidal ideas Ability to demonstrate self-control will improve Ability to identify and develop effective coping behaviors will improve Ability to maintain clinical measurements within normal limits will improve Compliance with prescribed medications will improve Ability to identify triggers associated with substance abuse/mental health issues will improve    Medication Management: Evaluate patient's response, side effects, and tolerance of medication regimen.  Therapeutic Interventions: 1 to 1 sessions, Unit Group sessions and Medication administration.  Evaluation of Outcomes: Progressing  Physician Treatment Plan for Secondary Diagnosis: Principal Problem:   MDD (major depressive disorder), recurrent severe, without psychosis ([place])  Long Term Goal(s): Improvement in symptoms so as ready for discharge  Short Term Goals:       Medication Management: Evaluate patient's response, side effects, and tolerance of medication regimen.  Therapeutic Interventions: 1 to 1 sessions, Unit Group sessions and Medication administration.  Evaluation of Outcomes: Progressing   RN Treatment Plan for Primary Diagnosis: MDD (major depressive disorder), recurrent severe, without psychosis ([place]) Long Term Goal(s): Knowledge of disease and therapeutic regimen to maintain  health will improve  Short Term Goals: Ability to remain free from injury will improve, Ability to verbalize frustration and anger appropriately will improve, Ability to demonstrate self-control, Ability to participate in decision making will improve, Ability to verbalize feelings will improve, Ability to disclose and discuss suicidal ideas, Ability to identify and develop effective coping behaviors will improve, and Compliance with prescribed medications will improve  Medication Management: RN will administer medications as ordered by provider, will assess and evaluate patient's response and provide education to patient for prescribed medication. RN will report any adverse and/or side effects to prescribing provider.  Therapeutic Interventions: 1 on 1 counseling sessions, Psychoeducation, Medication administration, Evaluate responses to treatment, Monitor vital signs and CBGs as ordered, Perform/monitor CIWA, COWS, AIMS and Fall Risk screenings as ordered, Perform wound care treatments as ordered.  Evaluation of Outcomes: Progressing   LCSW Treatment Plan for Primary Diagnosis: MDD (major depressive disorder), recurrent severe, without psychosis ([place]) Long Term Goal(s): Safe transition to appropriate next level of care at discharge, Engage patient in therapeutic group addressing interpersonal concerns.  Short Term Goals: Engage patient in aftercare planning with referrals and resources, Increase social support, Increase ability to appropriately verbalize feelings, Increase emotional regulation, Facilitate acceptance of mental health diagnosis and concerns, Facilitate patient progression through stages of change regarding substance use diagnoses and concerns, Identify triggers associated with mental health/substance abuse issues, and Increase skills for wellness and recovery  Therapeutic Interventions: Assess for all discharge needs, 1 to 1 time with Social worker, Explore available resources and  support systems, Assess for adequacy in community support network, Educate family and significant other(s) on suicide prevention, Complete Psychosocial Assessment, Interpersonal group therapy.  Evaluation of Outcomes: Progressing   Progress in Treatment: Attending groups: Yes. Participating in groups: Yes. Taking medication as prescribed: Yes. Toleration medication: Yes. Family/Significant other contact made: No, will contact:  Montine Circle [phone] [facility])  Patient understands diagnosis: Yes. Discussing patient identified problems/goals with staff: Yes. Medical problems stabilized or resolved: Yes. Denies suicidal/homicidal ideation: Yes. Issues/concerns per patient self-inventory: Yes. Other:   New problem(s) identified: No, Describe:  None Reported  New Short Term/Long Term Goal(s): ): medication stabilization, elimination of SI thoughts, development of comprehensive mental wellness plan.     Patient Goals:  Coping Skills  Discharge Plan or Barriers: Patient recently admitted. CSW will continue to follow and assess for appropriate referrals and possible discharge planning.     Reason for Continuation of Hospitalization: Depression Medication stabilization  Estimated Length of Stay: 3-7 Days  Last [place] Suicide Severity Risk Score: [place] Admission (Current) from 01/02/2023 in [facility] ED from 12/10/2022 in [place] [facility] Emergency Department at [facility] ED from 04/03/2022 in [facility] Emergency Department at [place] High Risk No Risk No Risk       Last PHQ 2/9 Scores:     No data to display          medication stabilization, elimination of SI thoughts, development of comprehensive mental wellness plan.    Scribe for Treatment  Team: Windle Guard, LCSW 01/03/2023 1:08 PM

## 2023-01-03 NOTE — [facility] Suicide Risk Assessment (Signed)
[place] INPATIENT:  Family/Significant Other Suicide Prevention Education  Suicide Prevention Education:  Education Completed; Montine Circle (([phone] Paramedic) has been identified by the patient as the family member/significant other with whom the patient will be residing, and identified as the person(s) who will aid the patient in the event of a mental health crisis (suicidal ideations/suicide attempt).  With written consent from the patient, the family member/significant other has been provided the following suicide prevention education, prior to the and/or following the discharge of the patient.  The suicide prevention education provided includes the following: Suicide risk factors Suicide prevention and interventions National Suicide Hotline telephone number [facility] assessment telephone number [facility] Emergency Assistance [place] and/or Residential Mobile Crisis Unit telephone number  Request made of family/significant other to: Remove weapons (e.g., guns, rifles, knives), all items previously/currently identified as safety concern.   Remove drugs/medications (over-the-counter, prescriptions, illicit drugs), all items previously/currently identified as a safety concern.  The family member/significant other verbalizes understanding of the suicide prevention education information provided.  The family member/significant other agrees to remove the items of safety concern listed above.  CSW spoke with Ms. Kenneth Washington who states that her Kenneth Washington has been "very depressed lately and has been [facility]  She confirms that her Fiance can return home after discharge and that "his mother took all of the guns and weapons in the home".  She states that he does not have any access to these firearms or weapons at his mother's house either.  CSW completed SPE with Ms. Kenneth Washington.  Kenneth Washington Kenneth Washington 01/03/2023, 2:26 PM

## 2023-01-03 NOTE — Plan of Care (Signed)
  Problem: Education: Goal: Knowledge of [place] [facility] Education information/materials will improve Outcome: Progressing Goal: Emotional status will improve Outcome: Progressing Goal: Mental status will improve Outcome: Progressing Goal: Verbalization of understanding the information provided will improve Outcome: Progressing   Problem: Activity: Goal: Interest or engagement in activities will improve Outcome: Progressing Goal: Sleeping patterns will improve Outcome: Progressing   

## 2023-01-03 NOTE — B[facility] Group Notes (Addendum)
Psychoeducational Group- Patients were encouraged to reflect on warning signs of mental decompensation and reflect on three coping skills that can help them when dysregulated. Pt attended and was appropriate.

## 2023-01-03 NOTE — Group Note (Signed)
Recreation Therapy Group Note   Group Topic:Problem Solving  Group Date: 01/03/2023 Start Time: 0930 End Time: 1000 Facilitators: Javen Ridings-McCall, LRT,CTRS Location: [address]   Goal Area(s) Addresses:  Patient will effectively work in a team with other group members. Patient will verbalize importance of using appropriate problem solving techniques.   Group Description:  Brain Teasers.  LRT gave each patient two copies of brain teasers.  Patients could work together or individually to figure out the answer to each puzzle.  When patients finished, LRT went over the sheets with the patients and used answer key to give the correct answers.     Affect/Mood: N/A   Participation Level: Did not attend    Clinical Observations/Individualized Feedback:     Plan: Continue to engage patient in RT group sessions 2-3x/week.   Teaghan Formica-McCall, LRT,CTRS 01/03/2023 1:02 PM
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
## 2023-01-03 NOTE — Group Note (Signed)
Date:  01/03/2023 Time:  11:08 AM  Group Topic/Focus:  Goals Group:   The focus of this group is to help patients establish daily goals to achieve during treatment and discuss how the patient can incorporate goal setting into their daily lives to aide in recovery. Orientation:   The focus of this group is to educate the patient on the purpose and policies of crisis stabilization and provide a format to answer questions about their admission.  The group details unit policies and expectations of patients while admitted.    Participation Level:  Active  Participation Quality:  Appropriate  Affect:  Appropriate  Cognitive:  Appropriate  Insight: Appropriate  Engagement in Group:  Engaged  Modes of Intervention:  Discussion  Additional Comments:  Patient attended morning orientation/goal group and said that his goal for today is to get better.   Desa Rech W Greidys [place] [id number], 11:08 AM

## 2023-01-03 NOTE — Progress Notes (Signed)
   01/03/23 0800  Psych Admission Type (Psych Patients Only)  Admission Status Involuntary  Psychosocial Assessment  Patient Complaints Depression;Irritability  Eye Contact Brief  Facial Expression Sad  Affect Anxious  Speech Logical/coherent  Interaction Assertive  Motor Activity Fidgety  Appearance/Hygiene Unremarkable  Behavior Characteristics Cooperative  Mood Anxious;Depressed;Sad  Thought Process  Coherency WDL  Content Blaming others  Delusions None reported or observed  Perception WDL  Hallucination None reported or observed  Judgment Impaired  Confusion None  Danger to Self  Current suicidal ideation? Denies  Agreement Not to Harm Self Yes  Description of Agreement Verbal  Danger to Others  Danger to Others None reported or observed
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
## 2023-01-04 NOTE — BHH Group Notes (Signed)
[facility] Group Notes:  (Nursing/MHT/Case Management/Adjunct)  Date:  01/04/2023  Time:  9:16 PM  Type of Therapy:   wrap up group  Participation Level:  Active  Participation Quality:  Appropriate  Affect:  Appropriate  Cognitive:  Appropriate  Insight:  Appropriate  Engagement in Group:  Engaged  Modes of Intervention:  Education  Summary of Progress/Problems:  Goal was to understand his emotions/feelings. Day was 7/10.  Kenneth Washington 01/04/2023, 9:16 PM

## 2023-01-04 NOTE — Plan of Care (Signed)
  Problem: Education: Goal: Emotional status will improve Outcome: Progressing Goal: Mental status will improve Outcome: Progressing   Problem: Coping: Goal: Ability to demonstrate self-control will improve Outcome: Progressing   Problem: Safety: Goal: Periods of time without injury will increase Outcome: Progressing

## 2023-01-04 NOTE — Progress Notes (Signed)
[facility] MD Progress Note  01/04/2023 8:26 AM [place] [facility]  MRN:  [id number]  Principal Problem: Adjustment disorder with anxiety Diagnosis: Principal Problem:   Adjustment disorder with anxiety Active Problems:   Suicidal thoughts   Anxiety disorder, unspecified   PTSD (post-traumatic stress disorder)  Subjective:  Patient is a 41 year old white male that has a psychiatric history of PTSD and Anxiety who was admitted to this unit for evaluation and treatment of intense suicidal thoughts with plan to shoot self.   Chart Review of Past 24 Hours: Per MAR, patient has been compliant with all scheduled medications. Patient has been attending groups appropriately and has not been a behavioral concern. Patient required the following behavioral PRNs:  Yesterday's Recommendations per Psychiatry Team: -Start cymbalta 30 mg once daily (pt was taking this prn at home) - for ptsd -restart home tizanidine prn and lyrica 100 mg bid -start hydroxyzine prn and trazidine prn    On Evaluation Today: Patient reports that his mood has been better since leaving [facility], but he is missing his family and children.  He states that the medications have caused dizziness and lightheadedness upon standing, but he has been taking things slowly.  He reports interacting on the milieu and not isolating himself.  He states that he wants to get better, wants to communicate better, and wants to have self-awareness to recognize depressive symptoms.  We talk at length about how depression sometimes presents his irritability rather than sadness.  He acknowledges that for some time, he has had decreased motivation and energy, has been irritable, and has had poor sleep and concentration on his classes without the use of marijuana.  We talked about how this is consistent with a major depressive disorder, and he voices understanding.  He is forward thinking and wants to get set up with therapy at the [place].  Patient does not  mention the incident that occurred, and minimizes stating that he has gotten past that.  We do spend some time discussing the suicide attempt, which he reports was impulsive, and he regrets it.  He is forward thinking and hopeful to rectify things with his fiance.  Patient shares a good deal of his military history and events that continue to affect him, although not as frequently as when they first occurred.  Patient denies SI, HI, AVH, acute paranoia (some chronic paranoia from [employer]), and does not voice delusions.  Other than the dizziness, he denies further somatic concerns or complaints.  Total Time spent with patient: 45 minutes  Past Psychiatric History: Previous Psych Diagnoses: PTSD, Anxiety Prior inpatient treatment:  denies Current/prior outpatient treatment: denies Psychotherapy hx: Patient reports he saw a therapist a year and a half ago through the [place] History of suicide: denies Psychiatric medication history: taking Cymbalta as needed (not scheduled)  Psychiatric medication compliance history: denies Neuromodulation history: denies Current Psychiatrist: at [place] Current therapist: at [place]   Past Medical History:  Past Medical History:  Diagnosis Date   Nephrolithiasis    Post traumatic stress disorder (PTSD)    Seizure ([place])     Past Surgical History:  Procedure Laterality Date   TONSILLECTOMY     Family History: History reviewed. No pertinent family history. Family Psychiatric  History:  Psych: denies  Psych Rx: denies SA/HA: Father an alcoholic  Substance use family hx: Father and brother Social History:  Social History   Substance and Sexual Activity  Alcohol Use Never     Social History   Substance and  Sexual Activity  Drug Use Yes   Types: Marijuana    Social History   Socioeconomic History   Marital status: Single    Spouse name: Not on file   Number of children: Not on file   Years of education: Not on file   Highest education level: Not on file   Occupational History   Not on file  Tobacco Use   Smoking status: Every Day    Packs/day: 1.00    Types: Cigarettes   Smokeless tobacco: Never  Substance and Sexual Activity   Alcohol use: Never   Drug use: Yes    Types: Marijuana   Sexual activity: Yes    Birth control/protection: None  Other Topics Concern   Not on file  Social History Narrative   Not on file   Social Determinants of Health   Financial Resource Strain: Not on file  Food Insecurity: No Food Insecurity (01/02/2023)   Hunger Vital Sign    Worried About Running Out of Food in the Last Year: Never true    Ran Out of Food in the Last Year: Never true  Transportation Needs: No Transportation Needs (01/02/2023)   PRAPARE - Hydrologist (Medical): No    Lack of Transportation (Non-Medical): No  Physical Activity: Not on file  Stress: Not on file  Social Connections: Not on file   Additional Social History:                         Sleep: Fair  Appetite:  Fair  Current Medications: Current Facility-Administered Medications  Medication Dose Route Frequency Provider Last Rate Last Admin   acetaminophen (TYLENOL) tablet 650 mg  650 mg Oral Q6H PRN Onuoha, Chinwendu V, NP   650 mg at 01/04/23 0621   alum & mag hydroxide-simeth (MAALOX/MYLANTA) 200-200-20 MG/5ML suspension 30 mL  30 mL Oral Q4H PRN Onuoha, Chinwendu V, NP       diphenhydrAMINE (BENADRYL) capsule 50 mg  50 mg Oral TID PRN Onuoha, Chinwendu V, NP       Or   diphenhydrAMINE (BENADRYL) injection 50 mg  50 mg Intramuscular TID PRN Onuoha, Chinwendu V, NP       DULoxetine (CYMBALTA) DR capsule 30 mg  30 mg Oral Daily Massengill, Ovid Curd, MD   30 mg at 01/04/23 [id number]   haloperidol (HALDOL) tablet 5 mg  5 mg Oral TID PRN Onuoha, Chinwendu V, NP       Or   haloperidol lactate (HALDOL) injection 5 mg  5 mg Intramuscular TID PRN Onuoha, Chinwendu V, NP       hydrOXYzine (ATARAX) tablet 25 mg  25 mg Oral TID PRN Nicholes Rough, NP       LORazepam (ATIVAN) tablet 2 mg  2 mg Oral TID PRN Onuoha, Chinwendu V, NP       Or   LORazepam (ATIVAN) injection 2 mg  2 mg Intramuscular TID PRN Onuoha, Chinwendu V, NP       magnesium hydroxide (MILK OF MAGNESIA) suspension 30 mL  30 mL Oral Daily PRN Onuoha, Chinwendu V, NP       nicotine polacrilex (NICORETTE) gum 2 mg  2 mg Oral PRN Massengill, Ovid Curd, MD   2 mg at 01/04/23 0812   pregabalin (LYRICA) capsule 100 mg  100 mg Oral Q8H Massengill, Nathan, MD   100 mg at 01/04/23 0621   tiZANidine (ZANAFLEX) tablet 4 mg  4 mg Oral Q8H PRN  Massengill, Ovid Curd, MD       traZODone (DESYREL) tablet 50 mg  50 mg Oral QHS Nicholes Rough, NP   50 mg at 01/03/23 2202    Lab Results:  No results found for this or any previous visit (from the past 71 hour(s)).  Blood Alcohol level:  No results found for: "ETH"  Metabolic Disorder Labs: No results found for: "HGBA1C", "MPG" No results found for: "PROLACTIN" No results found for: "CHOL", "TRIG", "HDL", "CHOLHDL", "VLDL", "LDLCALC"  Physical Findings: AIMS:   ,  ,   ,   ,     CIWA:    COWS:     Musculoskeletal: Strength & Muscle Tone: within normal limits Gait & Station: normal Patient leans: N/A  Psychiatric Specialty Exam:  Presentation  General Appearance:  Disheveled   Eye Contact: Fair   Speech: Normal Rate   Speech Volume: Decreased   Handedness:No data recorded   Mood and Affect  Mood: Anxious; Dysphoric; Worthless   Affect: Congruent; Depressed; Restricted    Thought Process  Thought Processes: Linear   Descriptions of Associations:Intact   Orientation:Full (Time, Place and Person)   Thought Content:Logical   History of Schizophrenia/Schizoaffective disorder:No data recorded  Duration of Psychotic Symptoms:No data recorded  Hallucinations:Hallucinations: None  Ideas of Reference:None   Suicidal Thoughts:Suicidal Thoughts: No  Homicidal Thoughts:Homicidal  Thoughts: No   Sensorium  Memory: Immediate Fair; Recent Fair; Remote Fair   Judgment: Impaired   Insight: Lacking    Executive Functions  Concentration: Poor   Attention Span: Poor   Recall: [employer] of Knowledge: Fair   Language: Fair    Psychomotor Activity  Psychomotor Activity: Psychomotor Activity: Normal   Assets  Assets:No data recorded   Sleep  Sleep: Sleep: Poor    Physical Exam: Physical Exam Vitals reviewed.  Constitutional:      Appearance: He is normal weight.  Pulmonary:     Effort: Pulmonary effort is normal. No respiratory distress.  Neurological:     Mental Status: He is alert.      Review of Systems  Constitutional:  Negative for chills and fever.  Cardiovascular:  Negative for chest pain and palpitations.  Neurological:  Negative for dizziness, tingling, tremors and headaches.  Psychiatric/Behavioral:  Positive for depression, substance abuse.  Negative for hallucinations, suicidal ideas, and memory loss. The patient is nervous/anxious. The patient does not have insomnia.    Blood pressure 116/89, pulse 82, temperature 98 F (36.7 C), temperature source Oral, resp. rate 16, height '5\' 10"'$  (1.778 m), weight 68.9 kg, SpO2 98 %. Body mass index is 21.81 kg/m.   Treatment Plan Summary: ASSESSMENT: Principal Problem:   Adjustment disorder with anxiety Active Problems:   Suicidal thoughts   Anxiety disorder, unspecified   PTSD (post-traumatic stress disorder)     PLAN: Safety and Monitoring:             -- Involuntary admission to inpatient psychiatric unit for safety, stabilization and treatment             -- Daily contact with patient to assess and evaluate symptoms and progress in treatment             -- Patient's case to be discussed in multi-disciplinary team meeting             -- Observation Level : q15 minute checks             -- Vital signs:  q12 hours             --  Precautions: suicide,  elopement, and assault   2. Psychiatric Diagnoses and Treatment:               -Continue cymbalta 30 mg once daily- for ptsd; will titrate to 60 mg on Monday, as patient prefers no medication changes today -Continue home tizanidine prn and lyrica 100 mg bid -Continue hydroxyzine prn and trazodone prn    --  The risks/benefits/side-effects/alternatives to this medication were discussed in detail with the patient and time was given for questions. The patient consents to medication trial.                -- Metabolic profile and EKG monitoring obtained while on an atypical antipsychotic (BMI: Lipid Panel: HbgA1c: QTc:)              -- Encouraged patient to participate in unit milieu and in scheduled group therapies              -- Short Term Goals: Ability to identify changes in lifestyle to reduce recurrence of condition will improve, Ability to verbalize feelings will improve, Ability to disclose and discuss suicidal ideas, Ability to demonstrate self-control will improve, Ability to identify and develop effective coping behaviors will improve, Ability to maintain clinical measurements within normal limits will improve, Compliance with prescribed medications will improve, and Ability to identify triggers associated with substance abuse/mental health issues will improve             -- Long Term Goals: Improvement in symptoms so as ready for discharge                3. Medical Issues Being Addressed:              Tobacco Use Disorder             -- Nicotine patch '21mg'$ /24 hours ordered             -- Smoking cessation encouraged   4. Discharge Planning:              -- Social work and case management to assist with discharge planning and identification of hospital follow-up needs prior to discharge             -- Estimated LOS: 5-7 days             -- Discharge Concerns: Need to establish a safety plan; Medication compliance and effectiveness             -- Discharge Goals: Return home with  outpatient referrals for mental health follow-up including medication management/psychotherapy at the [facility], MD 01/04/2023, 8:26 AM

## 2023-01-04 NOTE — Group Note (Signed)
Date:  01/04/2023 Time:  5:53 PM  Group Topic/Focus:  Self Care:   The focus of this group is to help patients understand the importance of self-care in order to improve or restore emotional, physical, spiritual, interpersonal, and financial health.    Participation Level:  Active  Participation Quality:  Appropriate  Affect:  Appropriate  Cognitive:  Alert  Insight: Appropriate  Engagement in Group:  Engaged  Modes of Intervention:    Explored ways that depression can have  far-reaching effects on many aspects of our lives. Additional Comments:     Jerrye Beavers 01/04/2023, 5:53 PM

## 2023-01-04 NOTE — Progress Notes (Signed)
   01/04/23 2200  Psych Admission Type (Psych Patients Only)  Admission Status Involuntary  Psychosocial Assessment  Patient Complaints Anxiety;Worrying  Eye Contact Fair  Facial Expression Worried  Affect Anxious  Theatre stage manager  Appearance/Hygiene Unremarkable  Behavior Characteristics Cooperative  Mood Anxious  Thought Process  Coherency WDL  Content Blaming others  Delusions None reported or observed  Perception WDL  Hallucination None reported or observed  Judgment Impaired  Confusion None  Danger to Self  Current suicidal ideation? Denies  Self-Injurious Behavior No self-injurious ideation or behavior indicators observed or expressed   Agreement Not to Harm Self Yes  Description of Agreement Verbal  Danger to Others  Danger to Others None reported or observed

## 2023-01-04 NOTE — Group Note (Signed)
Date:  01/04/2023 Time:  10:19 AM  Group Topic/Focus:  Goals Group:   The focus of this group is to help patients establish daily goals to achieve during treatment and discuss how the patient can incorporate goal setting into their daily lives to aide in recovery.    Participation Level:  Active  Participation Quality:  Appropriate  Affect:  Appropriate  Cognitive:  Appropriate  Insight: Appropriate  Engagement in Group:  Engaged  Modes of Intervention:  Discussion  Additional Comments:     Jerrye Beavers 01/04/2023, 10:19 AM

## 2023-01-04 NOTE — Group Note (Signed)
LCSW Group Therapy Note   Group Date: 01/04/2023 Start Time: 1000 End Time: 1100  Type of Therapy and Topic: Group Therapy: Relationship Check-Ins   Participation Level: Active   Description Group:  In this group patients are encouraged to rate how well or not well they are able to improve their relationships in Beliefs and Values, Communication, Family and Friends, Publishing rights manager and Household, and Intimacy. Patients will be able to discuss and identify what is going well within these aspects and what is not. Patients will be able to find appropriate ways, solutions, and skills that will help them within the selected relationships category. Patients will be encouraged to share and reflect on why things within their relationships are not going so well and get feedback from the instructor or their peers.  This group will be solution focused and process-oriented with patients' participation in sharing and listening to their own and peers experience; along with receive support and advice on how to improve or change the circumstance that is known to be challenging in their relationships category (Beliefs and Values, Communication, Family and Friends, Publishing rights manager and Household, and Intimacy).   Therapeutic Goals:  Patient will identify their strengths and weakness within their Beliefs and Values, Communication, Family and Friends, Publishing rights manager and Household, and Intimacy relationships. Patient will identify reasons why and how they can improve their relationships.  Patient will identify how they can be supportive and honest to themselves and in their relationships.  Patient will be able to gain support and give support to others with similar challenges.   Summary of Patient Progress Patient was present in group and accepted the provided worksheets. Patient started to follow along at first then just started actively listening because he would respond saying if something was going well or that he needed to work on  without looking at worksheet. Patient was open when going over the communication part. Patient shared positive and good insight to what he knows he needs to work on with his significant other. Patient left group and later on returned. Patient was appropriate and stayed until group was over.    Therapeutic Modalities:  Solution Focused Therapy Cognitive Behavioral Therapy  Psychodynamic Therapy  Dialectical Behavior Therapy   Silas Flood , [place]  01/04/2023  3:41 PM

## 2023-01-04 NOTE — Plan of Care (Signed)
  Problem: Education: Goal: Emotional status will improve Outcome: Progressing Goal: Mental status will improve Outcome: Progressing   Problem: Coping: Goal: Ability to demonstrate self-control will improve Outcome: Progressing   Problem: Safety: Goal: Periods of time without injury will increase Outcome: Progressing   

## 2023-01-04 NOTE — Progress Notes (Signed)
   01/03/23 2203  Psych Admission Type (Psych Patients Only)  Admission Status Involuntary  Psychosocial Assessment  Patient Complaints Anxiety;Worrying  Eye Contact Fair  Facial Expression Worried  Affect Anxious  Theatre stage manager  Appearance/Hygiene Unremarkable  Behavior Characteristics Cooperative  Mood Anxious  Thought Process  Coherency WDL  Content Blaming others  Delusions None reported or observed  Perception WDL  Hallucination None reported or observed  Judgment Impaired  Confusion None  Danger to Self  Current suicidal ideation? Denies  Agreement Not to Harm Self Yes  Description of Agreement Verbal  Danger to Others  Danger to Others None reported or observed

## 2023-01-04 NOTE — Progress Notes (Signed)
   01/04/23 0545  15 Minute Checks  Location Bedroom  Visual Appearance Calm  Behavior Sleeping  Sleep (Behavioral Health Patients Only)  Calculate sleep? (Click Yes once per 24 hr at 0600 safety check) Yes  Documented sleep last 24 hours 8.5

## 2023-01-05 MED ORDER — DULOXETINE HCL 60 MG PO CPEP
60.0000 mg | ORAL_CAPSULE | Freq: Every day | ORAL | Status: DC
  Administered 2023-01-06 – 2023-01-08 (×3): 60 mg via ORAL
  Filled 2023-01-05 (×4): qty 1

## 2023-01-05 NOTE — BHH Group Notes (Signed)
[facility] Group Notes:  (Nursing/MHT/Case Management/Adjunct)  Date:  01/05/2023  Time:  11:18 AM  Type of Therapy:  Group Therapy  Participation Level:  Did Not Attend  Summary of Progress/Problems:  Patient did not attend orientation/ goals group.   Frances Furbish R Caleyah Jr 01/05/2023, 11:18 AM

## 2023-01-05 NOTE — Progress Notes (Signed)
[facility] Group Notes:  (Nursing/MHT/Case Management/Adjunct)  Date:  01/05/2023  Time:  2000  Type of Therapy:   wrap up group  Participation Level:  Active  Participation Quality:  Appropriate, Attentive, Sharing, and Supportive  Affect:  Appropriate  Cognitive:  Alert  Insight:  Improving  Engagement in Group:  Engaged  Modes of Intervention:  Clarification, Education, and Support  Summary of Progress/Problems: Positive thinking and positive change were discussed.   Shellia Cleverly 01/05/2023, 9:44 PM

## 2023-01-05 NOTE — Plan of Care (Signed)
  Problem: Health Behavior/Discharge Planning: Goal: Compliance with treatment plan for underlying cause of condition will improve Outcome: Progressing   Problem: Safety: Goal: Periods of time without injury will increase Outcome: Progressing   

## 2023-01-05 NOTE — BHH Group Notes (Signed)
[facility] Group Notes:  (Nursing)  Date:  01/05/2023  Time: 400 PM  Type of Therapy:  Music Therapy  Participation Level:  Did Not Attend      Waymond Cera 01/05/2023, 7:44 PM

## 2023-01-05 NOTE — Progress Notes (Signed)
Patient is med compliant and cooperative on unit but labile/irritable at times. Patient denies SI,HI, and A/V/H with no plan or intent stating "Im ok" and avoidant about how he is feeling. Patient appeared more concentrated on his roommate and verbalized his concern. Patient did spend some time in day area watching movies and socializing and attended some groups. No s/s of current distress.

## 2023-01-05 NOTE — Progress Notes (Signed)
[facility] MD Progress Note  01/05/2023 1:33 PM Kenneth Washington  MRN:  [id number]  Principal Problem: Adjustment disorder with anxiety Diagnosis: Principal Problem:   Adjustment disorder with anxiety Active Problems:   Suicidal thoughts   Anxiety disorder, unspecified   PTSD (post-traumatic stress disorder)  Subjective:  Patient is a 42 year old white male that has a psychiatric history of PTSD and Anxiety who was admitted to this unit for evaluation and treatment of intense suicidal thoughts with plan to shoot self.   Chart Review of Past 24 Hours: Per MAR, patient has been compliant with all scheduled medications. Patient has been attending groups appropriately and has not been a behavioral concern. Patient required the following behavioral PRNs:  Yesterday's Recommendations per Psychiatry Team: -Start cymbalta 30 mg once daily (pt was taking this prn at home) - for ptsd -restart home tizanidine prn and lyrica 100 mg bid -start hydroxyzine prn and trazidine prn    On Evaluation Today: Patient reports that his mood is down and depressed, 4/10, as he is missing his family and children.  He asked about medication changes, but as we talk further, he is made aware that this is the first time he has sat with his thoughts and experiences without distractions; he is finally forced to process them.  He denies adverse effects of medications today.  He speaks at length about his dad and the trauma he endured as a child.  He does acknowledge that many of his experiences with dad are unresolved, and with dad being deceased, he would not get to process those things with him.  He is encouraged to write a letter to his dad, detailing all of the things that he would want to say to him.  We discussed forgiveness and how forgiveness occurs for oneself rather than the other party.  Patient remains forward thinking.  Patient denies SI, HI, AVH, acute paranoia (some chronic paranoia from [employer]), and does not voice  delusions.  He denies somatic concerns or complaints today.  He does confirm that his mother obtained his guns and locked them away in her gun safe.  Total Time spent with patient: 45 minutes  Past Psychiatric History: Previous Psych Diagnoses: PTSD, Anxiety Prior inpatient treatment:  denies Current/prior outpatient treatment: denies Psychotherapy hx: Patient reports he saw a therapist a year and a half ago through the [place] History of suicide: denies Psychiatric medication history: taking Cymbalta as needed (not scheduled)  Psychiatric medication compliance history: denies Neuromodulation history: denies Current Psychiatrist: at [place] Current therapist: at [place]   Past Medical History:  Past Medical History:  Diagnosis Date   Nephrolithiasis    Post traumatic stress disorder (PTSD)    Seizure ([place])     Past Surgical History:  Procedure Laterality Date   TONSILLECTOMY     Family History: History reviewed. No pertinent family history. Family Psychiatric  History:  Psych: denies  Psych Rx: denies SA/HA: Father an alcoholic  Substance use family hx: Father and brother Social History:  Social History   Substance and Sexual Activity  Alcohol Use Never     Social History   Substance and Sexual Activity  Drug Use Yes   Types: Marijuana    Social History   Socioeconomic History   Marital status: Single    Spouse name: Not on file   Number of children: Not on file   Years of education: Not on file   Highest education level: Not on file  Occupational History   Not on  file  Tobacco Use   Smoking status: Every Day    Packs/day: 1.00    Types: Cigarettes   Smokeless tobacco: Never  Substance and Sexual Activity   Alcohol use: Never   Drug use: Yes    Types: Marijuana   Sexual activity: Yes    Birth control/protection: None  Other Topics Concern   Not on file  Social History Narrative   Not on file   Social Determinants of Health   Financial Resource Strain: Not on  file  Food Insecurity: No Food Insecurity (01/02/2023)   Hunger Vital Sign    Worried About Running Out of Food in the Last Year: Never true    Ran Out of Food in the Last Year: Never true  Transportation Needs: No Transportation Needs (01/02/2023)   PRAPARE - Hydrologist (Medical): No    Lack of Transportation (Non-Medical): No  Physical Activity: Not on file  Stress: Not on file  Social Connections: Not on file   Additional Social History:                         Sleep: Fair  Appetite:  Fair  Current Medications: Current Facility-Administered Medications  Medication Dose Route Frequency Provider Last Rate Last Admin   acetaminophen (TYLENOL) tablet 650 mg  650 mg Oral Q6H PRN Onuoha, Chinwendu V, NP   650 mg at 01/05/23 0614   alum & mag hydroxide-simeth (MAALOX/MYLANTA) 200-200-20 MG/5ML suspension 30 mL  30 mL Oral Q4H PRN Onuoha, Chinwendu V, NP       diphenhydrAMINE (BENADRYL) capsule 50 mg  50 mg Oral TID PRN Onuoha, Chinwendu V, NP       Or   diphenhydrAMINE (BENADRYL) injection 50 mg  50 mg Intramuscular TID PRN Onuoha, Chinwendu V, NP       DULoxetine (CYMBALTA) DR capsule 30 mg  30 mg Oral Daily Massengill, Ovid Curd, MD   30 mg at 01/05/23 0749   haloperidol (HALDOL) tablet 5 mg  5 mg Oral TID PRN Onuoha, Chinwendu V, NP       Or   haloperidol lactate (HALDOL) injection 5 mg  5 mg Intramuscular TID PRN Onuoha, Chinwendu V, NP       hydrOXYzine (ATARAX) tablet 25 mg  25 mg Oral TID PRN Nicholes Rough, NP       LORazepam (ATIVAN) tablet 2 mg  2 mg Oral TID PRN Onuoha, Chinwendu V, NP       Or   LORazepam (ATIVAN) injection 2 mg  2 mg Intramuscular TID PRN Onuoha, Chinwendu V, NP       magnesium hydroxide (MILK OF MAGNESIA) suspension 30 mL  30 mL Oral Daily PRN Onuoha, Chinwendu V, NP       nicotine polacrilex (NICORETTE) gum 2 mg  2 mg Oral PRN Massengill, Ovid Curd, MD   2 mg at 01/05/23 1253   pregabalin (LYRICA) capsule 100 mg  100 mg  Oral Q8H Massengill, Nathan, MD   100 mg at 01/05/23 [id number]   tiZANidine (ZANAFLEX) tablet 4 mg  4 mg Oral Q8H PRN Massengill, Ovid Curd, MD   4 mg at 01/04/23 2058   traZODone (DESYREL) tablet 50 mg  50 mg Oral QHS Nicholes Rough, NP   50 mg at 01/04/23 2058    Lab Results:  No results found for this or any previous visit (from the past 48 hour(s)).  Blood Alcohol level:  No results found for: "ETH"  Metabolic Disorder Labs: No results found for: "HGBA1C", "MPG" No results found for: "PROLACTIN" No results found for: "CHOL", "TRIG", "HDL", "CHOLHDL", "VLDL", "LDLCALC"  Physical Findings: AIMS:   ,  ,   ,   ,     CIWA:    COWS:     Musculoskeletal: Strength & Muscle Tone: within normal limits Gait & Station: normal Patient leans: N/A  Psychiatric Specialty Exam:  Presentation  General Appearance:  Appropriate for Environment; Casual   Eye Contact: Good   Speech: Clear and Coherent; Normal Rate   Speech Volume: Normal   Handedness:Left    Mood and Affect  Mood: Depressed   Affect: Congruent; Depressed    Thought Process  Thought Processes: Coherent; Linear   Descriptions of Associations:Intact   Orientation:Full (Time, Place and Person)   Thought Content:Logical; WDL   History of Schizophrenia/Schizoaffective disorder:No data recorded  Duration of Psychotic Symptoms:No data recorded  Hallucinations:Hallucinations: None   Ideas of Reference:None   Suicidal Thoughts:Suicidal Thoughts: No   Homicidal Thoughts:Homicidal Thoughts: No    Sensorium  Memory: Immediate Good; Recent Good   Judgment: Fair   Insight: Fair    Community education officer  Concentration: Good   Attention Span: Good   Recall: Good   Fund of Knowledge: Good   Language: Good    Psychomotor Activity  Psychomotor Activity: Psychomotor Activity: Normal    Assets  Assets:Communication Skills; Desire for Improvement; Financial  Resources/Insurance; Housing; Resilience; Social Support    Sleep  Sleep: Sleep: Fair     Physical Exam: Physical Exam Vitals reviewed.  Constitutional:      Appearance: He is normal weight.  Pulmonary:     Effort: Pulmonary effort is normal. No respiratory distress.  Neurological:     Mental Status: He is alert.      Review of Systems  Constitutional:  Negative for chills and fever.  Cardiovascular:  Negative for chest pain and palpitations.  Neurological:  Negative for dizziness, tingling, tremors and headaches.  Psychiatric/Behavioral:  Positive for depression, substance abuse.  Negative for hallucinations, suicidal ideas, and memory loss. The patient is nervous/anxious. The patient does not have insomnia.    Blood pressure 120/82, pulse 76, temperature 97.9 F (36.6 C), temperature source Oral, resp. rate 16, height '5\' 10"'$  (1.778 m), weight 68.9 kg, SpO2 98 %. Body mass index is 21.81 kg/m.   Treatment Plan Summary: ASSESSMENT: Principal Problem:   Adjustment disorder with anxiety Active Problems:   Suicidal thoughts   Anxiety disorder, unspecified   PTSD (post-traumatic stress disorder)     PLAN: Safety and Monitoring:             -- Involuntary admission to inpatient psychiatric unit for safety, stabilization and treatment             -- Daily contact with patient to assess and evaluate symptoms and progress in treatment             -- Patient's case to be discussed in multi-disciplinary team meeting             -- Observation Level : q15 minute checks             -- Vital signs:  q12 hours             -- Precautions: suicide, elopement, and assault   2. Psychiatric Diagnoses and Treatment:               -Continue cymbalta 30 mg once daily- for ptsd; will  titrate to 60 mg tomorrow -Continue home tizanidine prn and lyrica 100 mg bid -Continue hydroxyzine prn and trazodone prn    --  The risks/benefits/side-effects/alternatives to this medication were  discussed in detail with the patient and time was given for questions. The patient consents to medication trial.                -- Metabolic profile and EKG monitoring obtained while on an atypical antipsychotic (BMI: Lipid Panel: HbgA1c: QTc:)              -- Encouraged patient to participate in unit milieu and in scheduled group therapies              -- Short Term Goals: Ability to identify changes in lifestyle to reduce recurrence of condition will improve, Ability to verbalize feelings will improve, Ability to disclose and discuss suicidal ideas, Ability to demonstrate self-control will improve, Ability to identify and develop effective coping behaviors will improve, Ability to maintain clinical measurements within normal limits will improve, Compliance with prescribed medications will improve, and Ability to identify triggers associated with substance abuse/mental health issues will improve             -- Long Term Goals: Improvement in symptoms so as ready for discharge                3. Medical Issues Being Addressed:              Tobacco Use Disorder             -- Nicotine patch '21mg'$ /24 hours ordered             -- Smoking cessation encouraged   4. Discharge Planning:              -- Social work and case management to assist with discharge planning and identification of hospital follow-up needs prior to discharge             -- Estimated LOS: 5-7 days             -- Discharge Concerns: Need to establish a safety plan; Medication compliance and effectiveness             -- Discharge Goals: Return home with outpatient referrals for mental health follow-up including medication management/psychotherapy at the [facility], MD 01/05/2023, 1:33 PM

## 2023-01-05 NOTE — Progress Notes (Signed)
   01/05/23 0552  15 Minute Checks  Location Bedroom  Visual Appearance Calm  Behavior Sleeping  Sleep (Behavioral Health Patients Only)  Calculate sleep? (Click Yes once per 24 hr at 0600 safety check) Yes  Documented sleep last 24 hours 8

## 2023-01-06 DIAGNOSIS — F4322 Adjustment disorder with anxiety: Principal | ICD-10-CM

## 2023-01-06 NOTE — Progress Notes (Signed)
   01/06/23 0800  Psych Admission Type (Psych Patients Only)  Admission Status Involuntary  Psychosocial Assessment  Patient Complaints Anxiety  Eye Contact Fair  Facial Expression Flat  Affect Appropriate to circumstance  Speech Logical/coherent  Interaction Assertive  Motor Activity Other (Comment) (WDL)  Appearance/Hygiene Unremarkable  Behavior Characteristics Cooperative  Mood Anxious  Thought Process  Coherency WDL  Content WDL  Delusions WDL  Perception WDL  Hallucination None reported or observed  Judgment Limited  Confusion None  Danger to Self  Current suicidal ideation? Denies  Self-Injurious Behavior No self-injurious ideation or behavior indicators observed or expressed   Agreement Not to Harm Self Yes  Description of Agreement Verbal  Danger to Others  Danger to Others None reported or observed

## 2023-01-06 NOTE — BHH Group Notes (Deleted)
Patient did not attend the charades group.

## 2023-01-06 NOTE — Plan of Care (Signed)
  Problem: Safety: Goal: Periods of time without injury will increase Outcome: Progressing   

## 2023-01-06 NOTE — Group Note (Unsigned)
Date:  01/06/2023 Time:  9:26 AM  Group Topic/Focus:  Goals Group:   The focus of this group is to help patients establish daily goals to achieve during treatment and discuss how the patient can incorporate goal setting into their daily lives to aide in recovery. Orientation:   The focus of this group is to educate the patient on the purpose and policies of crisis stabilization and provide a format to answer questions about their admission.  The group details unit policies and expectations of patients while admitted.     Participation Level:  {BHH PARTICIPATION WO:[id number]  Participation Quality:  {BHH PARTICIPATION QUALITY:22265}  Affect:  {BHH AFFECT:22266}  Cognitive:  {BHH COGNITIVE:22267}  Insight: {BHH Insight2:20797}  Engagement in Group:  {BHH ENGAGEMENT IN BP:[id number]  Modes of Intervention:  {BHH MODES OF INTERVENTION:22269}  Additional Comments:  ***  Dub Mikes 01/06/2023, 9:26 AM

## 2023-01-06 NOTE — Group Note (Signed)
Recreation Therapy Group Note   Group Topic:Stress Management  Group Date: 01/06/2023 Start Time: 0940 End Time: 0957 Facilitators: Krishang Reading-McCall, LRT,CTRS Location: [address] Dayroom   Goal Area(s) Addresses:  Patient will identify positive stress management techniques. Patient will identify benefits of using stress management post d/c.  Group Description:  Meditation.  LRT and patients discussed ways to handle stress.  LRT played a meditation that focused on making the most of your day and encouraging each person to have the confidence and courage to know they can change their environment and self-esteem.    Affect/Mood: Appropriate   Participation Level: Engaged   Participation Quality: Independent   Behavior: Appropriate   Speech/Thought Process: Focused   Insight: Good   Judgement: Good   Modes of Intervention: Meditation   Patient Response to Interventions:  Engaged   Education Outcome:  Acknowledges education and In group clarification offered    Clinical Observations/Individualized Feedback: Pt attended and participated in group session.    Plan: Continue to engage patient in RT group sessions 2-3x/week.   Gregory Dowe-McCall, LRT,CTRS 01/06/2023 1:03 PM

## 2023-01-06 NOTE — Progress Notes (Addendum)
[facility] MD Progress Note  01/06/2023 1:24 PM Kenneth Washington  MRN:  [id number]  Principal Problem: Adjustment disorder with anxiety Diagnosis: Principal Problem:   Adjustment disorder with anxiety Active Problems:   Suicidal thoughts   Anxiety disorder, unspecified   PTSD (post-traumatic stress disorder)   Reason for Admission:  Patient is a 42 year old white male that has a psychiatric history of PTSD and Anxiety who was admitted to this unit for evaluation and treatment of intense suicidal thoughts with plan to shoot self.   (admitted on 01/02/2023, total  LOS: 4 days )  Chart Review from last 24 hours:  Overnight events to report per chart review: None  Yesterday, the psychiatry team made following recommendations: -Continue cymbalta 30 mg once daily- for ptsd; will titrate to 60 mg tomorrow -Continue home tizanidine prn and lyrica 100 mg bid -Continue hydroxyzine prn and trazodone prn   Interval History: PRN Medications administered within the last 24 hours: Atarax 1 X, Ativan 1 X, nicotine 1 X, tizanidine 1 X Per nursing staff: Did not attend music therapy, participated in wrap up group   Information Obtained Today During Patient Interview: Patient evaluated at bedside, reports doing well.  Endorses adequate sleep and appetite.  Reports depression is still there, but improving with Cymbalta.  States he feels hyper today, denies any anxiety.  Reports he has had unpleasant amounts of energy since he has begun getting 7 to 8 hours of sleep nightly.  States he is used to feeling exhausted and sleep deprived.  Reports he requested Atarax as needed for this.  He denies any suicidal ideation.  Patient reports he is working on smoking cessation, currently using Nicorette gum which helps with his cravings.  He is motivated to stop smoking.  On assessment patient denies any homicidal ideation.  Denies any auditory or visual hallucinations.  There are no delusional thought processes appreciated.   There are no paranoid ideations appreciated.  Patient denies any side effects to currently prescribed psychiatric medications.  He denies any somatic complaints   Past Psychiatric Hx: Previous Psych Diagnoses: PTSD, Anxiety Prior inpatient treatment:  denies Current/prior outpatient treatment: denies Psychotherapy hx: Patient reports he saw a therapist a year and a half ago through the [place] History of suicide: denies Psychiatric medication history: taking Cymbalta as needed (not scheduled)  Psychiatric medication compliance history: denies Neuromodulation history: denies Current Psychiatrist: at [place] Current therapist: at [place]   Substance Abuse Hx: Alcohol: Has not drank for 8 months prior to the 5 beers he had on 12/31/2022 Tobacco: 1 pack and half daily Illicit drugs: THC daily  Rx drug abuse: denies Rehab hx: denies Past Medical History: Medical Diagnoses: knee pain, back pain Seizure: recently flashing lights triggers not prescribed medications Prior Surgeries: tonsillectomy, teeth extractions, vasectomy  Head trauma-while in the [employer]   Allergies: Strawberries PCP: at [place]  Past Medical History:  Past Medical History:  Diagnosis Date   Nephrolithiasis    Post traumatic stress disorder (PTSD)    Seizure ([place])    Family History: History reviewed. No pertinent family history. Family Psychiatric History: See H&P Social History: See H&P  Current Medications: Current Facility-Administered Medications  Medication Dose Route Frequency Provider Last Rate Last Admin   acetaminophen (TYLENOL) tablet 650 mg  650 mg Oral Q6H PRN Onuoha, Chinwendu V, NP   650 mg at 01/05/23 2127   alum & mag hydroxide-simeth (MAALOX/MYLANTA) 200-200-20 MG/5ML suspension 30 mL  30 mL Oral Q4H PRN Onuoha, Chinwendu V, NP  diphenhydrAMINE (BENADRYL) capsule 50 mg  50 mg Oral TID PRN Onuoha, Chinwendu V, NP       Or   diphenhydrAMINE (BENADRYL) injection 50 mg  50 mg Intramuscular TID PRN Onuoha,  Chinwendu V, NP       DULoxetine (CYMBALTA) DR capsule 60 mg  60 mg Oral Daily Rosezetta Schlatter, MD   60 mg at 01/06/23 0750   haloperidol (HALDOL) tablet 5 mg  5 mg Oral TID PRN Onuoha, Chinwendu V, NP       Or   haloperidol lactate (HALDOL) injection 5 mg  5 mg Intramuscular TID PRN Onuoha, Chinwendu V, NP       hydrOXYzine (ATARAX) tablet 25 mg  25 mg Oral TID PRN Nicholes Rough, NP   25 mg at 01/06/23 0959   LORazepam (ATIVAN) tablet 2 mg  2 mg Oral TID PRN Onuoha, Chinwendu V, NP   2 mg at 01/06/23 [id number]   Or   LORazepam (ATIVAN) injection 2 mg  2 mg Intramuscular TID PRN Onuoha, Chinwendu V, NP       magnesium hydroxide (MILK OF MAGNESIA) suspension 30 mL  30 mL Oral Daily PRN Onuoha, Chinwendu V, NP       nicotine polacrilex (NICORETTE) gum 2 mg  2 mg Oral PRN Massengill, Ovid Curd, MD   2 mg at 01/06/23 1255   pregabalin (LYRICA) capsule 100 mg  100 mg Oral Q8H Massengill, Nathan, MD   100 mg at 01/06/23 1255   tiZANidine (ZANAFLEX) tablet 4 mg  4 mg Oral Q8H PRN Massengill, Ovid Curd, MD   4 mg at 01/04/23 2058   traZODone (DESYREL) tablet 50 mg  50 mg Oral QHS Nicholes Rough, NP   50 mg at 01/05/23 2127    Lab Results: No results found for this or any previous visit (from the past 48 hour(s)).  Blood Alcohol level:  No results found for: "ETH"  Metabolic Labs: No results found for: "HGBA1C", "MPG" No results found for: "PROLACTIN" No results found for: "CHOL", "TRIG", "HDL", "CHOLHDL", "VLDL", "LDLCALC"  Sleep:Sleep: Fair   Physical Findings: AIMS: No  CIWA:    COWS:     Psychiatric Specialty Exam:  Presentation  General Appearance: Appropriate for environment; Fairly Groomed Eye Contact: Appropriate Speech: Clear and Coherent, hyperverbal Speech Volume: Normal Handedness: Unable to assess  Mood and Affect  Mood: Reports doing well Affect: Appears anxious; full range  Thought Process  Thought Processes:Coherent, Linear, Goal Directed Descriptions of Associations:  Intact Orientation:Full (Person, place, time) Thought Content:Linear; Logical  History of Schizophrenia/Schizoaffective disorder: None Duration of Psychotic Symptoms: None Hallucinations: None Ideas of Reference: None Suicidal Thoughts: None Homicidal Thoughts: None  Sensorium  Memory: Good Judgment: Good Insight: Good  Executive Functions  Concentration: Good Attention Span: Good Recall: Good Fund of Knowledge: Good Language: Good  Psychomotor Activity  Psychomotor Activity: Increased; restlessness; Energetic  Assets  Assets: Armed forces logistics/support/administrative officer; Desire for Improvement; Social Support  Sleep  Sleep: Good   ROS Respiratory: Denies SOB and cough. CV: Denies palpitations and CP. GI: Denies abdominal pain, nausea, vomiting and diarrhea. GU: Denies dysuria and urinary frequency. MSK: Denies myalgia and joint pain. Skin: Denies rash and pruritus. Psychiatric: Denies recent changes in mood. Denies anxiety and depression.  Physical Exam General: No acute distress. Awake and conversant. ENT: Hearing grossly intact. No nasal discharge. Neck: Neck is supple. No masses or thyromegaly. Respiratory: Respirations are non-labored. No wheezing. Skin: Warm. No rashes or ulcers.. Neuro: Sensation and CN II-XII grossly normal.  Blood pressure 130/88, pulse 77, temperature 97.7 F (36.5 C), temperature source Oral, resp. rate 16, height '5\' 10"'$  (1.778 m), weight 68.9 kg, SpO2 97 %. Body mass index is 21.81 kg/m.      Assets  Assets: Armed forces logistics/support/administrative officer; Desire for Improvement; Financial Resources/Insurance; Housing; Resilience; Social Support   Treatment Plan Summary: Daily contact with patient to assess and evaluate symptoms and progress in treatment and Medication management  ASSESSMENT: Principal Problem:   Adjustment disorder with anxiety Active Problems:   Suicidal thoughts   Anxiety disorder, unspecified   PTSD (post-traumatic stress disorder)      PLAN: Safety and Monitoring:             -- Involuntary admission to inpatient psychiatric unit for safety, stabilization and treatment             -- Daily contact with patient to assess and evaluate symptoms and progress in treatment             -- Patient's case to be discussed in multi-disciplinary team meeting             -- Observation Level : q15 minute checks             -- Vital signs:  q12 hours             -- Precautions: suicide, elopement, and assault   2. Psychiatric Diagnoses and Treatment:               -Start Cymbalta 60 mg daily for PTSD  -LFTs: pending -Continue home tizanidine prn and lyrica 100 mg bid -Continue hydroxyzine prn and trazodone prn    --  The risks/benefits/side-effects/alternatives to this medication were discussed in detail with the patient and time was given for questions. The patient consents to medication trial.             -- Encouraged patient to participate in unit milieu and in scheduled group therapies              -- Short Term Goals: Ability to identify changes in lifestyle to reduce recurrence of condition will improve, Ability to verbalize feelings will improve, Ability to disclose and discuss suicidal ideas, Ability to demonstrate self-control will improve, Ability to identify and develop effective coping behaviors will improve, Ability to maintain clinical measurements within normal limits will improve, Compliance with prescribed medications will improve, and Ability to identify triggers associated with substance abuse/mental health issues will improve             -- Long Term Goals: Improvement in symptoms so as ready for discharge                3. Medical Issues Being Addressed:              Tobacco Use Disorder             -- Nicotine patch '21mg'$ /24 hours ordered             -- Smoking cessation encouraged   4. Discharge Planning:              -- Social work and case management to assist with discharge planning and identification of  hospital follow-up needs prior to discharge             -- Estimated LOS: 5-7 days             -- Discharge Concerns: Need to establish a safety plan; Medication compliance and effectiveness             --  Discharge Goals: Return home with outpatient referrals for mental health follow-up including medication management/psychotherapy at the [facility]  I certify that inpatient services furnished can reasonably be expected to improve the patient's condition.    Dr. Jacques Navy, MD PGY-1, Psychiatry Residency  3/4/20241:24 PM

## 2023-01-06 NOTE — [facility] Group Notes (Signed)
Adult Psychoeducational Group Note  Date:  01/06/2023 Time:  4:51 PM  Group Topic/Focus:  Healthy Communication:   The focus of this group is to discuss communication, barriers to communication, as well as healthy ways to communicate with others.  Participation Level:  Active  Participation Quality:  Attentive  Affect:  Appropriate  Cognitive:  Alert  Insight: Appropriate  Engagement in Group:  Engaged  Modes of Intervention:  Activity  Additional Comments:  Patient attended and participated in the Resiliency group.  Annie Sable 01/06/2023, 4:51 PM

## 2023-01-06 NOTE — Progress Notes (Signed)
PRN PO ativan 2 mg and atarax 25 mg administered to patient at 0959 for extreme shakiness and tremors. Patient tolerated medication well with no side effect noted. Staff will continue to support patient.

## 2023-01-06 NOTE — [facility] Group Notes (Signed)
[place] Group Notes:  (Nursing/MHT/Case Management/Adjunct)  Date:  01/06/2023  Time:  8:47 PM  Type of Therapy:   AA  Participation Level:  Did Not Attend  Participation Quality:   na  Affect:   na  Cognitive:   na  Insight:  None  Engagement in Group:   na  Modes of Intervention:   na  Summary of Progress/Problems:  Pt did not attend .  Orvan Falconer 01/06/2023, 8:47 PM

## 2023-01-06 NOTE — Group Note (Unsigned)
Date:  01/06/2023 Time:  10:23 AM  Group Topic/Focus:  Goals Group:   The focus of this group is to help patients establish daily goals to achieve during treatment and discuss how the patient can incorporate goal setting into their daily lives to aide in recovery. Orientation:   The focus of this group is to educate the patient on the purpose and policies of crisis stabilization and provide a format to answer questions about their admission.  The group details unit policies and expectations of patients while admitted.     Participation Level:  {B[facility] PARTICIPATION WO:[id number]  Participation Quality:  {B[facility] PARTICIPATION QUALITY:22265}  Affect:  {BHH AFFECT:22266}  Cognitive:  {B[facility] COGNITIVE:22267}  Insight: {BHH Insight2:20797}  Engagement in Group:  {BHH ENGAGEMENT IN BP:8198245  Modes of Intervention:  {BHH MODES OF INTERVENTION:22269}  Additional Comments:  ***  Dub Mikes 01/06/2023, 10:23 AM

## 2023-01-06 NOTE — Progress Notes (Signed)
   01/06/23 0100  Psych Admission Type (Psych Patients Only)  Admission Status Involuntary  Psychosocial Assessment  Patient Complaints Anxiety  Eye Contact Fair  Facial Expression Flat  Affect Appropriate to circumstance  Speech Logical/coherent  Interaction Assertive  Motor Activity Other (Comment)  Appearance/Hygiene Unremarkable  Behavior Characteristics Cooperative  Mood Anxious  Thought Process  Coherency WDL  Content WDL  Delusions None reported or observed  Perception WDL  Hallucination None reported or observed  Judgment Limited  Confusion None  Danger to Self  Current suicidal ideation? Denies  Self-Injurious Behavior No self-injurious ideation or behavior indicators observed or expressed   Agreement Not to Harm Self Yes  Description of Agreement verbally contracts for safety  Danger to Others  Danger to Others None reported or observed   Alert/oriented. Makes needs/concerns known to staff. Pleasant cooperative with staff. Denies SI/HI/A/V hallucinations. Med compliant. Patient states went to group. Will encourage continue compliance and progression towards goals. Verbally contracted for safety. Will continue to monitor.

## 2023-01-06 NOTE — Progress Notes (Signed)
   01/06/23 2132  Psych Admission Type (Psych Patients Only)  Admission Status Involuntary  Psychosocial Assessment  Patient Complaints Anxiety  Eye Contact Fair  Facial Expression Flat  Affect Appropriate to circumstance  Speech Logical/coherent  Interaction Assertive  Motor Activity Other (Comment)  Appearance/Hygiene Unremarkable  Behavior Characteristics Cooperative  Mood Anxious  Thought Process  Coherency WDL  Content WDL  Delusions None reported or observed  Perception WDL  Hallucination None reported or observed  Judgment Limited  Confusion None  Danger to Self  Current suicidal ideation? Denies  Self-Injurious Behavior No self-injurious ideation or behavior indicators observed or expressed   Agreement Not to Harm Self Yes  Description of Agreement verbally contracts for safety  Danger to Others  Danger to Others None reported or observed   Alert/oriented. Makes needs/concerns known to staff. Pleasant cooperative with staff. Denies SI/HI/A/V hallucinations. Med compliant. Patient states went to group. Will encourage continue compliance and progression towards goals. Verbally contracted for safety. Will continue to monitor.

## 2023-01-06 NOTE — [facility] Group Notes (Signed)
Spiritual care group on grief and loss facilitated by Chaplain Janne Napoleon, Bcc and Lysle Morales, counseling intern.  Group Goal: Support / Education around grief and loss  Members engage in facilitated group support and psycho-social education.  Group Description:  Following introductions and group rules, group members engaged in facilitated group dialogue and support around topic of loss, with particular support around experiences of loss in their lives. Group Identified types of loss (relationships / self / things) and identified patterns, circumstances, and changes that precipitate losses. Reflected on thoughts / feelings around loss, normalized grief responses, and recognized variety in grief experience. Group encouraged individual reflection on safe space and on the coping skills that they are already utilizing.  Group drew on Adlerian / Rogerian and narrative framework  Patient Progress: Kenneth Washington attended group and participated in group activities.  His verbal participation was minimal, but he was engaged in the conversation and supportive of peers.  [address], [place] Pager, [phone]

## 2023-01-06 NOTE — Plan of Care (Signed)
  Problem: Education: Goal: Knowledge of [employer] Education information/materials will improve Outcome: Progressing Goal: Emotional status will improve Outcome: Progressing Goal: Mental status will improve Outcome: Progressing Goal: Verbalization of understanding the information provided will improve Outcome: Progressing   Problem: Activity: Goal: Interest or engagement in activities will improve Outcome: Progressing Goal: Sleeping patterns will improve Outcome: Progressing   Problem: Coping: Goal: Ability to verbalize frustrations and anger appropriately will improve Outcome: Progressing Goal: Ability to demonstrate self-control will improve Outcome: Progressing   

## 2023-01-06 NOTE — Group Note (Signed)
Occupational Therapy Group Note  Group Topic:Coping Skills  Group Date: 01/06/2023 Start Time: 1430 End Time: 1510 Facilitators: Brantley Stage, OT   Group Description: Group encouraged increased engagement and participation through discussion and activity focused on "Coping Ahead." Patients were split up into teams and selected a card from a stack of positive coping strategies. Patients were instructed to act out/charade the coping skill for other peers to guess and receive points for their team. Discussion followed with a focus on identifying additional positive coping strategies and patients shared how they were going to cope ahead over the weekend while continuing hospitalization stay.  Therapeutic Goal(s): Identify positive vs negative coping strategies. Identify coping skills to be used during hospitalization vs coping skills outside of hospital/at home Increase participation in therapeutic group environment and promote engagement in treatment   Participation Level: Engaged   Participation Quality: Independent   Behavior: Appropriate   Speech/Thought Process: Relevant   Affect/Mood: Appropriate   Insight: Fair   Judgement: Fair   Individualization: pt was engaged in their participation of group discussion/activity. New skills were identified  Modes of Intervention: Education  Patient Response to Interventions:  Attentive   Plan: Continue to engage patient in OT groups 2 - 3x/week.  01/06/2023  Brantley Stage, OT  Cornell Barman, OT

## 2023-01-06 NOTE — [facility] Group Notes (Signed)
Adult Psychoeducational Group Note  Date:  01/06/2023 Time:  5:00 PM  Group Topic/Focus:  Wellness Toolbox:   The focus of this group is to discuss various aspects of wellness, balancing those aspects and exploring ways to increase the ability to experience wellness.  Patients will create a wellness toolbox for use upon discharge.  Participation Level:  Active  Participation Quality:  Attentive  Affect:  Appropriate  Cognitive:  Alert  Insight: Appropriate  Engagement in Group:  Engaged  Modes of Intervention:  Activity  Additional Comments:  Patient attended and participated in the [place] group.  Annie Sable 01/06/2023, 5:00 PM

## 2023-01-07 DIAGNOSIS — F4322 Adjustment disorder with anxiety: Principal | ICD-10-CM

## 2023-01-07 LAB — COMPREHENSIVE METABOLIC PANEL
ALT: 23 U/L (ref 0–44)
AST: 18 U/L (ref 15–41)
Albumin: 3.9 g/dL (ref 3.5–5.0)
Alkaline Phosphatase: 48 U/L (ref 38–126)
Anion gap: 7 (ref 5–15)
BUN: 17 mg/dL (ref 6–20)
CO2: 27 mmol/L (ref 22–32)
Calcium: 9 mg/dL (ref 8.9–10.3)
Chloride: 105 mmol/L (ref 98–111)
Creatinine, Ser: 1.14 mg/dL (ref 0.61–1.24)
GFR, Estimated: >60 mL/min (ref >60)
Glucose, Bld: 93 mg/dL (ref 70–99)
Potassium: 3.7 mmol/L (ref 3.5–5.1)
Sodium: 139 mmol/L (ref 135–145)
Total Bilirubin: 0.7 mg/dL (ref 0.3–1.2)
Total Protein: 7.5 g/dL (ref 6.5–8.1)

## 2023-01-07 NOTE — Group Note (Unsigned)
Date:  01/07/2023 Time:  9:16 PM  Group Topic/Focus:  Wrap-Up Group:   The focus of this group is to help patients review their daily goal of treatment and discuss progress on daily workbooks.     Participation Level:  {B[facility] PARTICIPATION WO:[id number]  Participation Quality:  {BHH PARTICIPATION QUALITY:22265}  Affect:  {BHH AFFECT:22266}  Cognitive:  {B[facility] COGNITIVE:22267}  Insight: {BHH Insight2:20797}  Engagement in Group:  {BHH ENGAGEMENT IN BP:8198245  Modes of Intervention:  {B[facility] MODES OF INTERVENTION:22269}  Additional Comments:  ***  Debe Coder 01/07/2023, 9:16 PM

## 2023-01-07 NOTE — Group Note (Signed)
Date:  01/07/2023 Time:  10:16 AM  Group Topic/Focus:  Goals Group:   The focus of this group is to help patients establish daily goals to achieve during treatment and discuss how the patient can incorporate goal setting into their daily lives to aide in recovery. Orientation:   The focus of this group is to educate the patient on the purpose and policies of crisis stabilization and provide a format to answer questions about their admission.  The group details unit policies and expectations of patients while admitted.    Participation Level:  Active  Participation Quality:  Appropriate  Affect:  Anxious  Cognitive:  Appropriate  Insight: Appropriate  Engagement in Group:  Engaged  Modes of Intervention:  Discussion  Additional Comments:  Patient attended morning orientation/goal setting  group and said that his goal for today is to continue learning how to cope with depression and to discuss his discharge plan.  Taunja Brickner W Darlynn Ricco [id number], 10:16 AM

## 2023-01-07 NOTE — Progress Notes (Signed)
[facility] MD Progress Note  01/07/2023 8:45 AM [place] [facility]  MRN:  [id number]  Principal Problem: Adjustment disorder with anxiety Diagnosis: Principal Problem:   Adjustment disorder with anxiety Active Problems:   Suicidal thoughts   Anxiety disorder, unspecified   PTSD (post-traumatic stress disorder)   Reason for Admission:  Patient is a 42 year old white male that has a psychiatric history of PTSD and Anxiety who was admitted to this unit for evaluation and treatment of intense suicidal thoughts with plan to shoot self.   (admitted on 01/02/2023, total  LOS: 5 days )  Chart Review from last 24 hours:  Overnight events to report per chart review: None  Yesterday, the psychiatry team made following recommendations: -Start Cymbalta 60 mg daily for PTSD            -LFTs: pending -Continue home tizanidine prn and lyrica 100 mg bid -Continue hydroxyzine prn and trazodone prn   Interval History: PRN Medications administered within the last 24 hours: tylenol 1x, atarax 1x, ativan 1x, nicorette gum 1x, tizanidine Per nursing staff: no acute complaints.    Information Obtained Today During Patient Interview: Patient evaluated at bedside. Reports he is doing well today, restlessness improved.  Describes mood today as "calm".  Rates his depression 3/10 and anxiety 2/10. Endorses adequate sleep and appetite. Denies any suicidal ideation.   On assessment he denies homicidal ideation. Denies auditory and visual hallucinations. No paranoid ideations. No delusional thought content.   Denies any side effects to currently prescribed psychiatric medications. Denies any somatic symptoms.    Past Psychiatric Hx: Previous Psych Diagnoses: PTSD, Anxiety Prior inpatient treatment:  denies Current/prior outpatient treatment: denies Psychotherapy hx: Patient reports he saw a therapist a year and a half ago through the [place] History of suicide: denies Psychiatric medication history: taking Cymbalta as  needed (not scheduled)  Psychiatric medication compliance history: denies Neuromodulation history: denies Current Psychiatrist: at [place] Current therapist: at [place]   Substance Abuse Hx: Alcohol: Has not drank for 8 months prior to the 5 beers he had on 12/31/2022 Tobacco: 1 pack and half daily Illicit drugs: THC daily  Rx drug abuse: denies Rehab hx: denies Past Medical History: Medical Diagnoses: knee pain, back pain Seizure: recently flashing lights triggers not prescribed medications Prior Surgeries: tonsillectomy, teeth extractions, vasectomy  Head trauma-while in the [employer]   Allergies: Strawberries PCP: at [place]  Past Medical History:  Past Medical History:  Diagnosis Date   Nephrolithiasis    Post traumatic stress disorder (PTSD)    Seizure ([place])    Family History: History reviewed. No pertinent family history. Family Psychiatric History: See H&P Social History: See H&P  Current Medications: Current Facility-Administered Medications  Medication Dose Route Frequency Provider Last Rate Last Admin   acetaminophen (TYLENOL) tablet 650 mg  650 mg Oral Q6H PRN Onuoha, Chinwendu V, NP   650 mg at 01/05/23 2127   alum & mag hydroxide-simeth (MAALOX/MYLANTA) 200-200-20 MG/5ML suspension 30 mL  30 mL Oral Q4H PRN Onuoha, Chinwendu V, NP       diphenhydrAMINE (BENADRYL) capsule 50 mg  50 mg Oral TID PRN Onuoha, Chinwendu V, NP       Or   diphenhydrAMINE (BENADRYL) injection 50 mg  50 mg Intramuscular TID PRN Onuoha, Chinwendu V, NP       DULoxetine (CYMBALTA) DR capsule 60 mg  60 mg Oral Daily Rosezetta Schlatter, MD   60 mg at 01/06/23 0750   haloperidol (HALDOL) tablet 5 mg  5 mg Oral  TID PRN Onuoha, Chinwendu V, NP       Or   haloperidol lactate (HALDOL) injection 5 mg  5 mg Intramuscular TID PRN Onuoha, Chinwendu V, NP       hydrOXYzine (ATARAX) tablet 25 mg  25 mg Oral TID PRN Nicholes Rough, NP   25 mg at 01/06/23 0959   LORazepam (ATIVAN) tablet 2 mg  2 mg Oral TID PRN Onuoha,  Chinwendu V, NP   2 mg at 01/06/23 [id number]   Or   LORazepam (ATIVAN) injection 2 mg  2 mg Intramuscular TID PRN Onuoha, Chinwendu V, NP       magnesium hydroxide (MILK OF MAGNESIA) suspension 30 mL  30 mL Oral Daily PRN Onuoha, Chinwendu V, NP       nicotine polacrilex (NICORETTE) gum 2 mg  2 mg Oral PRN Massengill, Ovid Curd, MD   2 mg at 01/06/23 1807   pregabalin (LYRICA) capsule 100 mg  100 mg Oral Q8H Massengill, Ovid Curd, MD   100 mg at 01/07/23 0558   tiZANidine (ZANAFLEX) tablet 4 mg  4 mg Oral Q8H PRN Massengill, Ovid Curd, MD   4 mg at 01/04/23 2058   traZODone (DESYREL) tablet 50 mg  50 mg Oral QHS Nicholes Rough, NP   50 mg at 01/06/23 2106    Lab Results:  Results for orders placed or performed during the hospital encounter of 01/02/23 (from the past 48 hour(s))  Comprehensive metabolic panel     Status: None   Collection Time: 01/07/23  6:37 AM  Result Value Ref Range   Sodium 139 135 - 145 mmol/L   Potassium 3.7 3.5 - 5.1 mmol/L   Chloride 105 98 - 111 mmol/L   CO2 27 22 - 32 mmol/L   Glucose, Bld 93 70 - 99 mg/dL    Comment: Glucose reference range applies only to samples taken after fasting for at least 8 hours.   BUN 17 6 - 20 mg/dL   Creatinine, Ser 1.14 0.61 - 1.24 mg/dL   Calcium 9.0 8.9 - 10.3 mg/dL   Total Protein 7.5 6.5 - 8.1 g/dL   Albumin 3.9 3.5 - 5.0 g/dL   AST 18 15 - 41 U/L   ALT 23 0 - 44 U/L   Alkaline Phosphatase 48 38 - 126 U/L   Total Bilirubin 0.7 0.3 - 1.2 mg/dL   GFR, Estimated >60 >60 mL/min    Comment: (NOTE) Calculated using the CKD-EPI Creatinine Equation (2021)    Anion gap 7 5 - 15    Comment: Performed at [facility], [place] [address], [place], [place] [address]    Blood Alcohol level:  No results found for: "ETH"  Metabolic Labs: No results found for: "HGBA1C", "MPG" No results found for: "PROLACTIN" No results found for: "CHOL", "TRIG", "HDL", "CHOLHDL", "VLDL", "LDLCALC"  Sleep:No data recorded   Physical  Findings: AIMS: No  CIWA:    COWS:     Psychiatric Specialty Exam:  Presentation  General Appearance: Appropriate for environment; Fairly Groomed Eye Contact: Appropriate Speech: Clear and Coherent, hyperverbal Speech Volume: Normal Handedness: Unable to assess  Mood and Affect  Mood: "Calm" Affect: Appears anxious; full range  Thought Process  Thought Processes:Coherent, Linear, Goal Directed Descriptions of Associations: Intact Orientation:Full (Person, place, time) Thought Content:Linear; Logical  History of Schizophrenia/Schizoaffective disorder: None Duration of Psychotic Symptoms: None Hallucinations: None Ideas of Reference: None Suicidal Thoughts: None Homicidal Thoughts: None  Sensorium  Memory: Good Judgment: Good Insight: Oceanographer  Concentration: Good Attention Span: Good Recall: Good Fund of Knowledge: Good Language: Good  Psychomotor Activity  Psychomotor Activity: Normal  Assets  Assets: Communication Skills; Desire for Improvement; Social Support  Sleep  Sleep: Good   ROS Respiratory: Denies SOB and cough. CV: Denies palpitations and CP. GI: Denies abdominal pain, nausea, vomiting and diarrhea. GU: Denies dysuria and urinary frequency. MSK: Denies myalgia and joint pain. Skin: Denies rash and pruritus. Psychiatric: Denies recent changes in mood. Denies anxiety and depression.  Physical Exam General: No acute distress. Awake and conversant. ENT: Hearing grossly intact. No nasal discharge. Neck: Neck is supple. No masses or thyromegaly. Respiratory: Respirations are non-labored. No wheezing. Skin: Warm. No rashes or ulcers.. Neuro: Sensation and CN II-XII grossly normal.  Blood pressure 114/87, pulse 78, temperature 97.7 F (36.5 C), temperature source Oral, resp. rate 16, height '5\' 10"'$  (1.778 m), weight 68.9 kg, SpO2 99 %. Body mass index is 21.81 kg/m.      Assets  Assets: Armed forces logistics/support/administrative officer; Desire for  Improvement; Financial Resources/Insurance; Housing; Resilience; Social Support   Treatment Plan Summary: Daily contact with patient to assess and evaluate symptoms and progress in treatment and Medication management  ASSESSMENT: Principal Problem:   Adjustment disorder with anxiety Active Problems:   Suicidal thoughts   Anxiety disorder, unspecified   PTSD (post-traumatic stress disorder)     PLAN: Safety and Monitoring:             -- Involuntary admission to inpatient psychiatric unit for safety, stabilization and treatment             -- Daily contact with patient to assess and evaluate symptoms and progress in treatment             -- Patient's case to be discussed in multi-disciplinary team meeting             -- Observation Level : q15 minute checks             -- Vital signs:  q12 hours             -- Precautions: suicide, elopement, and assault   2. Psychiatric Diagnoses and Treatment:               -Continue Cymbalta 60 mg daily for PTSD            -LFTs: WNL -Continue home tizanidine prn and lyrica 100 mg bid -Continue hydroxyzine prn and trazodone prn    --  The risks/benefits/side-effects/alternatives to this medication were discussed in detail with the patient and time was given for questions. The patient consents to medication trial.             -- Encouraged patient to participate in unit milieu and in scheduled group therapies              -- Short Term Goals: Ability to identify changes in lifestyle to reduce recurrence of condition will improve, Ability to verbalize feelings will improve, Ability to disclose and discuss suicidal ideas, Ability to demonstrate self-control will improve, Ability to identify and develop effective coping behaviors will improve, Ability to maintain clinical measurements within normal limits will improve, Compliance with prescribed medications will improve, and Ability to identify triggers associated with substance abuse/mental health  issues will improve             -- Long Term Goals: Improvement in symptoms so as ready for discharge  3. Medical Issues Being Addressed:              Tobacco Use Disorder             -- Nicotine patch '21mg'$ /24 hours ordered             -- Smoking cessation encouraged   4. Discharge Planning:              -- Social work and case management to assist with discharge planning and identification of hospital follow-up needs prior to discharge  -- Requests scripts be printed at discharge             -- Estimated LOS: Tomorrow, 01/08/2023, requires appointment at [facility] in [place].              -- Discharge Concerns: Need to establish a safety plan; Medication compliance and effectiveness             -- Discharge Goals: Return home with outpatient referrals for mental health follow-up including medication management/psychotherapy at the [facility]  I certify that inpatient services furnished can reasonably be expected to improve the patient's condition.    Dr. Jacques Navy, MD PGY-1, Psychiatry Residency  3/5/20248:45 AM

## 2023-01-07 NOTE — Group Note (Unsigned)
Date:  01/07/2023 Time:  8:54 PM  Group Topic/Focus:  Wrap-Up Group:   The focus of this group is to help patients review their daily goal of treatment and discuss progress on daily workbooks.     Participation Level:  {BHH PARTICIPATION WO:[id number]  Participation Quality:  {BHH PARTICIPATION QUALITY:22265}  Affect:  {BHH AFFECT:22266}  Cognitive:  {BHH COGNITIVE:22267}  Insight: {BHH Insight2:20797}  Engagement in Group:  {BHH ENGAGEMENT IN BP:[id number]  Modes of Intervention:  {BHH MODES OF INTERVENTION:22269}  Additional Comments:  ***  Debe Coder 01/07/2023, 8:54 PM

## 2023-01-07 NOTE — Group Note (Unsigned)
Date:  01/07/2023 Time:  9:01 PM  Group Topic/Focus:  Wrap-Up Group:   The focus of this group is to help patients review their daily goal of treatment and discuss progress on daily workbooks.     Participation Level:  {BHH PARTICIPATION HD:[id number]  Participation Quality:  {BHH PARTICIPATION QUALITY:22265}  Affect:  {BHH AFFECT:22266}  Cognitive:  {BHH COGNITIVE:22267}  Insight: {BHH Insight2:20797}  Engagement in Group:  {BHH ENGAGEMENT IN JY:[id number]  Modes of Intervention:  {BHH MODES OF INTERVENTION:22269}  Additional Comments:  ***  Debe Coder 01/07/2023, 9:01 PM

## 2023-01-07 NOTE — Group Note (Signed)
Date:  01/07/2023 Time:  10:46 AM  Group Topic/Focus:  Making Healthy Choices:   The focus of this group is to help patients identify negative/unhealthy choices they were using prior to admission and identify positive/healthier coping strategies to replace them upon discharge.    Participation Level:  Did Not Attend   [place] Coach Rebekah Sprinkle [id number], 10:46 AM

## 2023-01-07 NOTE — Progress Notes (Signed)
Adult Psychoeducational Group Note  Date:  01/07/2023 Time:  9:42 PM  Group Topic/Focus:  Wrap-Up Group:   The focus of this group is to help patients review their daily goal of treatment and discuss progress on daily workbooks.  Participation Level:  Active  Participation Quality:  Appropriate  Affect:  Appropriate  Cognitive:  Appropriate  Insight: Appropriate  Engagement in Group:  Engaged  Modes of Intervention:  Exploration  Additional Comments:  Patient attended and participated in group tonight. He reports that today he learn that he will be leaving tomorrow.  Kenneth Washington [facility] 01/07/2023, 9:42 PM

## 2023-01-07 NOTE — Group Note (Unsigned)
Date:  01/07/2023 Time:  8:50 PM  Group Topic/Focus:  Wrap-Up Group:   The focus of this group is to help patients review their daily goal of treatment and discuss progress on daily workbooks.     Participation Level:  {BHH PARTICIPATION HD:[id number]  Participation Quality:  {BHH PARTICIPATION QUALITY:22265}  Affect:  {BHH AFFECT:22266}  Cognitive:  {BHH COGNITIVE:22267}  Insight: {BHH Insight2:20797}  Engagement in Group:  {BHH ENGAGEMENT IN JY:[id number]  Modes of Intervention:  {BHH MODES OF INTERVENTION:22269}  Additional Comments:  ***  Debe Coder 01/07/2023, 8:50 PM

## 2023-01-07 NOTE — Group Note (Unsigned)
Date:  01/07/2023 Time:  8:56 PM  Group Topic/Focus:  Wrap-Up Group:   The focus of this group is to help patients review their daily goal of treatment and discuss progress on daily workbooks.     Participation Level:  {BHH PARTICIPATION WO:[id number]  Participation Quality:  {BHH PARTICIPATION QUALITY:22265}  Affect:  {BHH AFFECT:22266}  Cognitive:  {BHH COGNITIVE:22267}  Insight: {BHH Insight2:20797}  Engagement in Group:  {BHH ENGAGEMENT IN BP:8198245  Modes of Intervention:  {BHH MODES OF INTERVENTION:22269}  Additional Comments:  ***  Debe Coder 01/07/2023, 8:56 PM

## 2023-01-07 NOTE — Group Note (Signed)
LCSW Group Therapy Note   Group Date: 01/07/2023 Start Time: 1100 End Time: 1200  Type of Therapy and Topic:  Group Therapy:  Self-Care after Hospitalization  Participation Level:  Active   Description of Group This process group involved patients discussing how they plan to take care of themselves in a better manner when they get home from the hospital.  The group started with patients listing one healthy self-care they hope to engage in at discharge that they did not use prior to admission.  We discussed a variety of other means of self-care which had a large range from hygiene activities to eating to setting boundaries to participating in peer support groups.  The primary focus by CSW was to point out commonalities.  When there were participants who stated everyone else can get help, but they themselves are "beyond help," this was discussed in detail and this belief was gently challenged.  Finally, it was announced that immediately following group was to be "Hygiene Hour" where everyone would go to their rooms and take care of their personal hygiene.  This was met with wide acceptance.  Therapeutic Goals Patient will identify and describe one self-care activity to deliberately plan to use upon hospital discharge Patient will participate in generating additional ideas about healthy self-care options when they return to the community Patients will be supportive of one another and receive support from others Patients will be challenged to realize that they are not "beyond help" any more than other participants in the room are  Summary of Patient Progress:  The Pt attended group and remained there the entire time.  The Pt accepted all worksheets and materials provided.  The Pt demonstrated understanding of the topic being discussed by asking questions and participating in open discussion with their peers and staff.  The Pt was appropriate with all peers and staff.  The Pt was able to create a plan  of action and set goals for after discharge.    Therapeutic Modalities Brief Solution-Focused Therapy Psychoeducation  Darleen Crocker, [place] 01/07/2023  1:07 PM

## 2023-01-07 NOTE — Progress Notes (Signed)
   01/07/23 1300  Psych Admission Type (Psych Patients Only)  Admission Status Involuntary  Psychosocial Assessment  Patient Complaints Anxiety  Eye Contact Fair  Facial Expression Flat  Affect Appropriate to circumstance  Speech Logical/coherent  Interaction Assertive  Motor Activity Other (Comment) (WDL)  Appearance/Hygiene Unremarkable  Behavior Characteristics Cooperative  Mood Depressed;Anxious  Thought Process  Coherency WDL  Content WDL  Delusions None reported or observed  Perception WDL  Hallucination None reported or observed  Judgment WDL  Confusion None  Danger to Self  Current suicidal ideation? Denies  Danger to Others  Danger to Others None reported or observed   Pt denies SI/HI/AVH.  Presents with flat affect but it improves after talking to pt for a few minutes.  Pt interacting with peers and attending groups.  Pt took medications without incident.

## 2023-01-07 NOTE — Group Note (Unsigned)
Date:  01/07/2023 Time:  8:45 PM  Group Topic/Focus:  Wrap-Up Group:   The focus of this group is to help patients review their daily goal of treatment and discuss progress on daily workbooks.     Participation Level:  {BHH PARTICIPATION HD:[id number]  Participation Quality:  {BHH PARTICIPATION QUALITY:22265}  Affect:  {BHH AFFECT:22266}  Cognitive:  {BHH COGNITIVE:22267}  Insight: {BHH Insight2:20797}  Engagement in Group:  {BHH ENGAGEMENT IN JY:[id number]  Modes of Intervention:  {BHH MODES OF INTERVENTION:22269}  Additional Comments:  ***  Debe Coder 01/07/2023, 8:45 PM

## 2023-01-07 NOTE — Group Note (Signed)
Recreation Therapy Group Note   Group Topic:Animal Assisted Therapy   Group Date: 01/07/2023 Start Time: 1430 End Time: 1500 Facilitators: Aubria Vanecek-McCall, LRT,CTRS Location: [address] Dayroom   Animal-Assisted Activity (AAA) Program Checklist/Progress Notes Patient Eligibility Criteria Checklist & Daily Group note for Rec Tx Intervention  AAA/T Program Assumption of Risk Form signed by Patient/ or Parent Legal Guardian Yes  Patient is free of allergies or severe asthma Yes  Patient reports no fear of animals Yes  Patient reports no history of cruelty to animals Yes  Patient understands his/her participation is voluntary Yes  Patient washes hands before animal contact Yes  Patient washes hands after animal contact Yes   Affect/Mood: Appropriate   Participation Level: Engaged   Participation Quality: Independent   Behavior: Appropriate    Clinical Observations/Individualized Feedback:  Patient attended session and interacted appropriately with therapy dog and peers. Patient asked appropriate questions about therapy dog and his training. Patient shared stories about their pets at home with group.    Plan: Continue to engage patient in RT group sessions 2-3x/week.   Kenneth Washington, LRT,CTRS 01/07/2023 4:26 PM

## 2023-01-07 NOTE — Group Note (Unsigned)
Date:  01/07/2023 Time:  8:58 PM  Group Topic/Focus:  Wrap-Up Group:   The focus of this group is to help patients review their daily goal of treatment and discuss progress on daily workbooks.     Participation Level:  {BHH PARTICIPATION WO:[id number]  Participation Quality:  {BHH PARTICIPATION QUALITY:22265}  Affect:  {BHH AFFECT:22266}  Cognitive:  {BHH COGNITIVE:22267}  Insight: {BHH Insight2:20797}  Engagement in Group:  {BHH ENGAGEMENT IN BP:[id number]  Modes of Intervention:  {BHH MODES OF INTERVENTION:22269}  Additional Comments:  ***  Debe Coder 01/07/2023, 8:58 PM

## 2023-01-08 ENCOUNTER — Encounter (HOSPITAL_COMMUNITY): Payer: Self-pay

## 2023-01-08 MED ORDER — NICOTINE POLACRILEX 2 MG MT GUM: 2.0000 mg | CHEWING_GUM | OROMUCOSAL | 0 refills | Status: AC | PRN

## 2023-01-08 MED ORDER — PREGABALIN 100 MG PO CAPS: 100.0000 mg | ORAL_CAPSULE | Freq: Three times a day (TID) | ORAL | 0 refills | Status: AC

## 2023-01-08 MED ORDER — TIZANIDINE HCL 4 MG PO TABS: 4.0000 mg | ORAL_TABLET | Freq: Three times a day (TID) | ORAL | 0 refills | Status: DC | PRN

## 2023-01-08 MED ORDER — NICOTINE POLACRILEX 2 MG MT GUM: 2.0000 mg | CHEWING_GUM | OROMUCOSAL | 0 refills | Status: DC | PRN

## 2023-01-08 MED ORDER — TRAZODONE HCL 50 MG PO TABS: 50.0000 mg | ORAL_TABLET | Freq: Every day | ORAL | 0 refills | Status: DC

## 2023-01-08 MED ORDER — DULOXETINE HCL 60 MG PO CPEP: 60.0000 mg | ORAL_CAPSULE | Freq: Every day | ORAL | 0 refills | Status: DC

## 2023-01-08 MED ORDER — TRAZODONE HCL 50 MG PO TABS: 50.0000 mg | ORAL_TABLET | Freq: Every day | ORAL | 0 refills | Status: AC

## 2023-01-08 MED ORDER — TIZANIDINE HCL 4 MG PO TABS: 4.0000 mg | ORAL_TABLET | Freq: Three times a day (TID) | ORAL | 0 refills | Status: AC | PRN

## 2023-01-08 MED ORDER — DULOXETINE HCL 60 MG PO CPEP: 60.0000 mg | ORAL_CAPSULE | Freq: Every day | ORAL | 0 refills | Status: AC

## 2023-01-08 MED ORDER — PREGABALIN 100 MG PO CAPS: 100.0000 mg | ORAL_CAPSULE | Freq: Three times a day (TID) | ORAL | 0 refills | Status: DC

## 2023-01-08 NOTE — Plan of Care (Signed)
  Problem: Education: Goal: Knowledge of [place] [facility] Education information/materials will improve Outcome: Adequate for Discharge Goal: Emotional status will improve Outcome: Adequate for Discharge Goal: Mental status will improve Outcome: Adequate for Discharge Goal: Verbalization of understanding the information provided will improve Outcome: Adequate for Discharge   Problem: Activity: Goal: Interest or engagement in activities will improve Outcome: Adequate for Discharge Goal: Sleeping patterns will improve Outcome: Adequate for Discharge   Problem: Coping: Goal: Ability to verbalize frustrations and anger appropriately will improve Outcome: Adequate for Discharge Goal: Ability to demonstrate self-control will improve Outcome: Adequate for Discharge   Problem: Health Behavior/Discharge Planning: Goal: Identification of resources available to assist in meeting health care needs will improve Outcome: Adequate for Discharge Goal: Compliance with treatment plan for underlying cause of condition will improve Outcome: Adequate for Discharge   Problem: Physical Regulation: Goal: Ability to maintain clinical measurements within normal limits will improve Outcome: Adequate for Discharge   Problem: Safety: Goal: Periods of time without injury will increase Outcome: Adequate for Discharge   
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
## 2023-01-08 NOTE — Progress Notes (Signed)
   01/08/23 0600  15 Minute Checks  Location Hallway  Visual Appearance Calm  Behavior Composed  Sleep (Behavioral Health Patients Only)  Calculate sleep? (Click Yes once per 24 hr at 0600 safety check) Yes  Documented sleep last 24 hours 7.25

## 2023-01-08 NOTE — Group Note (Unsigned)
Date:  01/08/2023 Time:  9:45 AM  Group Topic/Focus:  Goals Group:   The focus of this group is to help patients establish daily goals to achieve during treatment and discuss how the patient can incorporate goal setting into their daily lives to aide in recovery. Orientation:   The focus of this group is to educate the patient on the purpose and policies of crisis stabilization and provide a format to answer questions about their admission.  The group details unit policies and expectations of patients while admitted.     Participation Level:  {BHH PARTICIPATION WO:[id number]  Participation Quality:  {BHH PARTICIPATION QUALITY:22265}  Affect:  {BHH AFFECT:22266}  Cognitive:  {BHH COGNITIVE:22267}  Insight: {BHH Insight2:20797}  Engagement in Group:  {BHH ENGAGEMENT IN BP:[id number]  Modes of Intervention:  {BHH MODES OF INTERVENTION:22269}  Additional Comments:  ***  Garvin Fila 01/08/2023, 9:45 AM

## 2023-01-08 NOTE — Group Note (Signed)
Date:  01/08/2023 Time:  9:58 AM  Group Topic/Focus:  Goals Group:   The focus of this group is to help patients establish daily goals to achieve during treatment and discuss how the patient can incorporate goal setting into their daily lives to aide in recovery. Orientation:   The focus of this group is to educate the patient on the purpose and policies of crisis stabilization and provide a format to answer questions about their admission.  The group details unit policies and expectations of patients while admitted.    Participation Level:  Active  Participation Quality:  Appropriate  Affect:  Appropriate  Cognitive:  Appropriate  Insight: Appropriate  Engagement in Group:  Engaged  Modes of Intervention:  Discussion  Additional Comments:    Garvin Fila 01/08/2023, 9:58 AM
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
## 2023-01-08 NOTE — Group Note (Unsigned)
Date:  01/08/2023 Time:  9:53 AM  Group Topic/Focus:  Goals Group:   The focus of this group is to help patients establish daily goals to achieve during treatment and discuss how the patient can incorporate goal setting into their daily lives to aide in recovery. Orientation:   The focus of this group is to educate the patient on the purpose and policies of crisis stabilization and provide a format to answer questions about their admission.  The group details unit policies and expectations of patients while admitted.     Participation Level:  {BHH PARTICIPATION HD:996081  Participation Quality:  {BHH PARTICIPATION QUALITY:22265}  Affect:  {BHH AFFECT:22266}  Cognitive:  {BHH COGNITIVE:22267}  Insight: {BHH Insight2:20797}  Engagement in Group:  {BHH ENGAGEMENT IN JY:3131603  Modes of Intervention:  {BHH MODES OF INTERVENTION:22269}  Additional Comments:  ***  Garvin Fila 01/08/2023, 9:53 AM

## 2023-01-08 NOTE — BH IP Treatment Plan (Signed)
Interdisciplinary Treatment and Diagnostic Plan Update  01/08/2023 Time of Session: 10:25am  Kenneth Washington MRN: [id number]  Principal Diagnosis: Adjustment disorder with anxiety  Secondary Diagnoses: Principal Problem:   Adjustment disorder with anxiety Active Problems:   Suicidal thoughts   Anxiety disorder, unspecified   PTSD (post-traumatic stress disorder)   Current Medications:  Current Facility-Administered Medications  Medication Dose Route Frequency Provider Last Rate Last Admin   acetaminophen (TYLENOL) tablet 650 mg  650 mg Oral Q6H PRN Onuoha, Chinwendu V, NP   650 mg at 01/05/23 2127   alum & mag hydroxide-simeth (MAALOX/MYLANTA) 200-200-20 MG/5ML suspension 30 mL  30 mL Oral Q4H PRN Onuoha, Chinwendu V, NP       diphenhydrAMINE (BENADRYL) capsule 50 mg  50 mg Oral TID PRN Onuoha, Chinwendu V, NP       Or   diphenhydrAMINE (BENADRYL) injection 50 mg  50 mg Intramuscular TID PRN Onuoha, Chinwendu V, NP       DULoxetine (CYMBALTA) DR capsule 60 mg  60 mg Oral Daily Rosezetta Schlatter, MD   60 mg at 01/08/23 0802   haloperidol (HALDOL) tablet 5 mg  5 mg Oral TID PRN Onuoha, Chinwendu V, NP       Or   haloperidol lactate (HALDOL) injection 5 mg  5 mg Intramuscular TID PRN Onuoha, Chinwendu V, NP       hydrOXYzine (ATARAX) tablet 25 mg  25 mg Oral TID PRN Nicholes Rough, NP   25 mg at 01/06/23 0959   LORazepam (ATIVAN) tablet 2 mg  2 mg Oral TID PRN Onuoha, Chinwendu V, NP   2 mg at 01/06/23 [id number]   Or   LORazepam (ATIVAN) injection 2 mg  2 mg Intramuscular TID PRN Onuoha, Chinwendu V, NP       magnesium hydroxide (MILK OF MAGNESIA) suspension 30 mL  30 mL Oral Daily PRN Onuoha, Chinwendu V, NP       nicotine polacrilex (NICORETTE) gum 2 mg  2 mg Oral PRN Massengill, Ovid Curd, MD   2 mg at 01/07/23 1811   pregabalin (LYRICA) capsule 100 mg  100 mg Oral Q8H Massengill, Nathan, MD   100 mg at 01/08/23 0606   tiZANidine (ZANAFLEX) tablet 4 mg  4 mg Oral Q8H PRN Massengill, Ovid Curd, MD    4 mg at 01/04/23 2058   traZODone (DESYREL) tablet 50 mg  50 mg Oral QHS Nicholes Rough, NP   50 mg at 01/07/23 2106   PTA Medications: Medications Prior to Admission  Medication Sig Dispense Refill Last Dose   pregabalin (LYRICA) 100 MG capsule Take 100 mg by mouth 2 (two) times daily. Started at [facility]      tiZANidine (ZANAFLEX) 4 MG tablet Take 4 mg by mouth every 8 (eight) hours as needed for muscle spasms.   Past Month    Patient Stressors:    Patient Strengths:    Treatment Modalities: Medication Management, Group therapy, Case management,  1 to 1 session with clinician, Psychoeducation, Recreational therapy.   Physician Treatment Plan for Primary Diagnosis: Adjustment disorder with anxiety Long Term Goal(s): Improvement in symptoms so as ready for discharge   Short Term Goals: Ability to identify changes in lifestyle to reduce recurrence of condition will improve Ability to verbalize feelings will improve Ability to disclose and discuss suicidal ideas Ability to demonstrate self-control will improve Ability to identify and develop effective coping behaviors will improve Ability to maintain clinical measurements within normal limits will improve Compliance with prescribed medications will  improve Ability to identify triggers associated with substance abuse/mental health issues will improve  Medication Management: Evaluate patient's response, side effects, and tolerance of medication regimen.  Therapeutic Interventions: 1 to 1 sessions, Unit Group sessions and Medication administration.  Evaluation of Outcomes: Adequate for Discharge  Physician Treatment Plan for Secondary Diagnosis: Principal Problem:   Adjustment disorder with anxiety Active Problems:   Suicidal thoughts   Anxiety disorder, unspecified   PTSD (post-traumatic stress disorder)  Long Term Goal(s): Improvement in symptoms so as ready for discharge   Short Term Goals: Ability to identify  changes in lifestyle to reduce recurrence of condition will improve Ability to verbalize feelings will improve Ability to disclose and discuss suicidal ideas Ability to demonstrate self-control will improve Ability to identify and develop effective coping behaviors will improve Ability to maintain clinical measurements within normal limits will improve Compliance with prescribed medications will improve Ability to identify triggers associated with substance abuse/mental health issues will improve     Medication Management: Evaluate patient's response, side effects, and tolerance of medication regimen.  Therapeutic Interventions: 1 to 1 sessions, Unit Group sessions and Medication administration.  Evaluation of Outcomes: Adequate for Discharge   RN Treatment Plan for Primary Diagnosis: Adjustment disorder with anxiety Long Term Goal(s): Knowledge of disease and therapeutic regimen to maintain health will improve  Short Term Goals: Ability to remain free from injury will improve, Ability to participate in decision making will improve, Ability to verbalize feelings will improve, Ability to disclose and discuss suicidal ideas, and Ability to identify and develop effective coping behaviors will improve  Medication Management: RN will administer medications as ordered by provider, will assess and evaluate patient's response and provide education to patient for prescribed medication. RN will report any adverse and/or side effects to prescribing provider.  Therapeutic Interventions: 1 on 1 counseling sessions, Psychoeducation, Medication administration, Evaluate responses to treatment, Monitor vital signs and CBGs as ordered, Perform/monitor CIWA, COWS, AIMS and Fall Risk screenings as ordered, Perform wound care treatments as ordered.  Evaluation of Outcomes: Adequate for Discharge   LCSW Treatment Plan for Primary Diagnosis: Adjustment disorder with anxiety Long Term Goal(s): Safe transition  to appropriate next level of care at discharge, Engage patient in therapeutic group addressing interpersonal concerns.  Short Term Goals: Engage patient in aftercare planning with referrals and resources, Increase social support, Increase emotional regulation, Facilitate acceptance of mental health diagnosis and concerns, Identify triggers associated with mental health/substance abuse issues, and Increase skills for wellness and recovery  Therapeutic Interventions: Assess for all discharge needs, 1 to 1 time with Social worker, Explore available resources and support systems, Assess for adequacy in community support network, Educate family and significant other(s) on suicide prevention, Complete Psychosocial Assessment, Interpersonal group therapy.  Evaluation of Outcomes: Adequate for Discharge   Progress in Treatment: Attending groups: Yes. Participating in groups: Yes. Taking medication as prescribed: Yes. Toleration medication: Yes. Family/Significant other contact made: Yes, will contact:  [address] [phone] [facility])  Patient understands diagnosis: Yes. Discussing patient identified problems/goals with staff: Yes. Medical problems stabilized or resolved: Yes. Denies suicidal/homicidal ideation: Yes. Issues/concerns per patient self-inventory: No Other:    New problem(s) identified: No, Describe:  None Reported   New Short Term/Long Term Goal(s): medication stabilization, elimination of SI thoughts, development of comprehensive mental wellness plan.      Patient Goals:  Coping Skills   Discharge Plan or Barriers: Patient will return home and follow-up with the [facility] [place] in [place] for therapy and medication  management services.      Reason for Continuation of Hospitalization:  Medication stabilization   Estimated Length of Stay: Adequate for Discharge   Last [place] Suicide Severity Risk Score: Flowsheet Row Admission (Current) from 01/02/2023 in [facility] ED from [facility]/10/20[facility] in [facility] at [facility] ED from 04/03/2022 in [facility] Emergency Department at [place] High Risk No Risk No Risk       Last PHQ 2/9 Scores:     No data to display          Scribe for Treatment Team: Darleen Crocker, [place] 01/08/2023 10:00 AM

## 2023-01-08 NOTE — Progress Notes (Signed)
Patient discharged with all belongings and medications. Patient given the opportunity to ask questions.

## 2023-01-08 NOTE — Progress Notes (Signed)
  [facility] Adult Case Management Discharge Plan :  Will you be returning to the same living situation after discharge:  Yes,  Home  At discharge, do you have transportation home?: Yes,  Fiance  Do you have the ability to pay for your medications: Yes,  [facility] Insurance   Release of information consent forms completed and in the chart;  Patient's signature needed at discharge.  Patient to Follow up at:  Follow-up Information     Clinic, [place]. Go on 01/23/2023.   Why: You have a hospital follow up appointment for therapy and medication management services on  01/23/23 at 10:30 am.       This appointment will be held in person. Contact information: [place] [address] [phone]                 Next level of care provider has access to [place] and Suicide Prevention discussed: Yes,  With patient and Fiance      Has patient been referred to the Quitline?: Patient refused referral  Patient has been referred for addiction treatment: Yes, Patient may request substance use services during their appointment with the [facility].   Darleen Crocker, LCSWA 01/08/2023, 9:22 AM

## 2024-04-08 ENCOUNTER — Ambulatory Visit (HOSPITAL_BASED_OUTPATIENT_CLINIC_OR_DEPARTMENT_OTHER)
Admission: EM | Admit: 2024-04-08 | Discharge: 2024-04-08 | Disposition: A | Discharge status: Home / Self Care | Attending: Family Medicine | Admitting: Family Medicine

## 2024-04-08 ENCOUNTER — Encounter (HOSPITAL_BASED_OUTPATIENT_CLINIC_OR_DEPARTMENT_OTHER): Payer: Self-pay | Admitting: Emergency Medicine

## 2024-04-08 DIAGNOSIS — Z91030 Bee allergy status: Secondary | ICD-10-CM | POA: Diagnosis not present

## 2024-04-08 DIAGNOSIS — T63461A Toxic effect of venom of wasps, accidental (unintentional), initial encounter: Secondary | ICD-10-CM | POA: Diagnosis not present

## 2024-04-08 MED ORDER — METHYLPREDNISOLONE ACETATE 80 MG/ML IJ SUSP
80.0000 mg | Freq: Once | INTRAMUSCULAR | Status: AC
  Administered 2024-04-08: 80 mg via INTRAMUSCULAR

## 2024-04-08 MED ORDER — EPINEPHRINE 0.3 MG/0.3ML IJ SOAJ: 0.3000 mg | INTRAMUSCULAR | 2 refills | Status: DC | PRN

## 2024-04-08 MED ORDER — EPINEPHRINE 0.3 MG/0.3ML IJ SOAJ: 0.3000 mg | INTRAMUSCULAR | 2 refills | Status: AC | PRN

## 2024-04-08 NOTE — ED Triage Notes (Addendum)
 Pt was stung by a yellow jacket 2 days ago he has redness noted to left arm and he states its warm to the touch. Pt has taken benadryl  and applied cold compress

## 2024-04-08 NOTE — ED Provider Notes (Signed)
 [name]    CSN: [id number] Arrival date & time: 04/08/24  1103      History   Chief Complaint No chief complaint on file.   HPI Kenneth Washington is a 43 y.o. male.   Patient was started by a yellow jacket on his left hand on the thumb pad on 04/06/2024.  He immediately saw that the stinger was still and he was careful to scrape it with the knife so I did not inject him further.  He immediately had some redness and pain.  He has never had an insect or behavioral wasp allergy before.  His mother has significant allergies to bees and wasp.  Yesterday he marked with a blue marker on his skin approximately 1 to 2 mm outside the red border.  The red border was bigger than the day of the sting which was 04/06/2024.  Today the red border has extended to the markings and beyond that was bigger than it was yesterday and bigger than the day of the initial sting area is slightly warm and the actual sting is still painful.  He is not having any difficulty breathing or shortness of breath nor other symptoms he has used Benadryl  OTC and he has applied cold compresses.     Past Medical History:  Diagnosis Date   Nephrolithiasis    Post traumatic stress disorder (PTSD)    Seizure [facility])     Patient Active Problem List   Diagnosis Date Noted   Suicidal thoughts 01/03/2023   Anxiety disorder, unspecified 01/03/2023   PTSD (post-traumatic stress disorder) 01/03/2023   Adjustment disorder with anxiety 01/03/2023    Past Surgical History:  Procedure Laterality Date   TONSILLECTOMY         Home Medications    Prior to Admission medications   Medication Sig Start Date End Date Taking? Authorizing Provider  EPINEPHrine (EPIPEN 2-PAK) 0.3 mg/0.3 mL IJ SOAJ injection Inject 0.3 mg into the muscle as needed for anaphylaxis. 04/08/24  Yes Guss Legacy, FNP  tiZANidine  (ZANAFLEX ) 4 MG tablet Take 1 tablet (4 mg total) by mouth every 8 (eight) hours as needed for muscle spasms. 01/08/23  Yes  Carrion-Carrero, Margely, MD  DULoxetine  (CYMBALTA ) 60 MG capsule Take 1 capsule (60 mg total) by mouth daily. 01/08/23   Carrion-Carrero, Jacalyn Martin, MD  nicotine  polacrilex (NICORETTE ) 2 MG gum Take 1 each (2 mg total) by mouth as needed for smoking cessation. 01/08/23   Carrion-Carrero, Jacalyn Martin, MD  pregabalin  (LYRICA ) 100 MG capsule Take 1 capsule (100 mg total) by mouth every 8 (eight) hours. 01/08/23   Carrion-Carrero, Jacalyn Martin, MD  traZODone  (DESYREL ) 50 MG tablet Take 1 tablet (50 mg total) by mouth at bedtime. 01/08/23   Baltazar Bonier, MD    Family History History reviewed. No pertinent family history.  Social History Social History   Tobacco Use   Smoking status: Every Day    Current packs/day: 1.00    Types: Cigarettes   Smokeless tobacco: Never  Substance Use Topics   Alcohol use: Never   Drug use: Yes    Types: Marijuana     Allergies   Strawberry (diagnostic)   Review of Systems Review of Systems  Constitutional:  Negative for chills and fever.  HENT:  Negative for ear pain and sore throat.   Eyes:  Negative for pain and visual disturbance.  Respiratory:  Negative for cough.   Cardiovascular:  Negative for chest pain and palpitations.  Gastrointestinal:  Negative for abdominal pain, constipation,  diarrhea, nausea and vomiting.  Genitourinary:  Negative for dysuria and hematuria.  Musculoskeletal:  Negative for arthralgias and back pain.  Skin:  Positive for wound (Red area on left palm and hand at the site of yellowjacket sting.). Negative for color change and rash.  Neurological:  Negative for seizures and syncope.  All other systems reviewed and are negative.    Physical Exam Triage Vital Signs ED Triage Vitals  Encounter Vitals Group     BP 04/08/24 1113 115/80     Systolic BP Percentile --      Diastolic BP Percentile --      Pulse Rate 04/08/24 1113 72     Resp 04/08/24 1113 18     Temp 04/08/24 1113 97.7 F (36.5 C)     Temp Source 04/08/24  1113 Oral     SpO2 04/08/24 1113 96 %     Weight --      Height --      Head Circumference --      Peak Flow --      Pain Score 04/08/24 1112 0     Pain Loc --      Pain Education --      Exclude from Growth Chart --    No data found.  Updated Vital Signs BP 115/80 (BP Location: Right Arm)   Pulse 72   Temp 97.7 F (36.5 C) (Oral)   Resp 18   SpO2 96%   Visual Acuity Right Eye Distance:   Left Eye Distance:   Bilateral Distance:    Right Eye Near:   Left Eye Near:    Bilateral Near:     Physical Exam Vitals and nursing note reviewed.  Constitutional:      General: He is not in acute distress.    Appearance: He is well-developed. He is not ill-appearing or toxic-appearing.  HENT:     Head: Normocephalic and atraumatic.     Right Ear: External ear normal.     Left Ear: External ear normal.     Nose: Nose normal.     Mouth/Throat:     Lips: Pink.     Mouth: Mucous membranes are moist.  Eyes:     Conjunctiva/sclera: Conjunctivae normal.     Pupils: Pupils are equal, round, and reactive to light.  Cardiovascular:     Rate and Rhythm: Normal rate and regular rhythm.     Heart sounds: S1 normal and S2 normal. No murmur heard. Pulmonary:     Effort: Pulmonary effort is normal. No respiratory distress.     Breath sounds: Normal breath sounds. No decreased breath sounds, wheezing, rhonchi or rales.  Musculoskeletal:        General: No swelling.  Skin:    General: Skin is warm and dry.     Capillary Refill: Capillary refill takes less than 2 seconds.     Findings: Wound (Left hand and wrist on the palmar surface with an insect bite and erythema with swelling and tenderness.  See photos for specific areas.) present. No rash.  Neurological:     Mental Status: He is alert and oriented to person, place, and time.  Psychiatric:        Mood and Affect: Mood normal.           UC Treatments / Results  Labs (all labs ordered are listed, but only abnormal results  are displayed) Labs Reviewed - No data to display  EKG   Radiology No results found.  Procedures Procedures (including critical care time)  Medications Ordered in UC Medications  methylPREDNISolone acetate (DEPO-MEDROL) injection 80 mg (80 mg Intramuscular Given 04/08/24 1147)    Initial Impression / Assessment and Plan / UC Course  I have reviewed the triage vital signs and the nursing notes.  Pertinent labs & imaging results that were available during my care of the patient were reviewed by me and considered in my medical decision making (see chart for details).  Plan of Care: Yellowjacket allergy  and local allergic reaction: Depo-Medrol 80 mg injection during the visit.  Encouraged to use OTC cetirizine or loratadine as needed for itching and to help with the allergic reaction.  Provided an EpiPen kit to use if needed acutely for allergic reaction.  Advised to keep the EpiPen's available and that it is good to have two (not just one pen) in case you have to take a second injection.  Local allergic reaction: Reviewed signs and symptoms that would indicate infection instead of allergic reaction and encouraged to return here if he feels like he has any signs of infection.  He might need to be reevaluated and started on antibiotics.  Follow-up if symptoms do not improve, worsen or new symptoms occur.  I reviewed the plan of care with the patient and/or the patient's guardian.  The patient and/or guardian had time to ask questions and acknowledged that the questions were answered.  I provided instruction on symptoms or reasons to return here or to go to an ER, if symptoms/condition did not improve, worsened or if new symptoms occurred.  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  Use OTC cetirizine or loratadine or Benadryl  to help with the allergic reaction.  Provided EpiPen for use acutely with future yellowjacket or other insect or bee stings.  Advised to carry the EpiPen with him and to carry two at a time, in case he needs more than one injection.  If the site becomes more red, swollen or has discharge, return for reevaluation for possible secondary cellulitis or infection.  Follow-up if symptoms do not improve, worsen or new symptoms occur.   ED Prescriptions     Medication Sig Dispense Auth. Provider   EPINEPHrine (EPIPEN 2-PAK) 0.3 mg/0.3 mL IJ SOAJ injection Inject 0.3 mg into the muscle as needed for anaphylaxis. 1 each Guss Legacy, FNP      PDMP not reviewed this encounter.   Guss Legacy, FNP 04/08/24 1236

## 2024-04-08 NOTE — Discharge Instructions (Addendum)
 Hand check: Depo-Medrol 80 mg injection in the office, given (steroid injection).  Use OTC cetirizine or loratadine or Benadryl  to help with the allergic reaction.  Provided EpiPen for use acutely with future yellowjacket or other insect or bee stings.  Advised to carry the EpiPen with him and to carry two at a time, in case he needs more than one injection.  If the site becomes more red, swollen or has discharge, return for reevaluation for possible secondary cellulitis or infection.  Follow-up if symptoms do not improve, worsen or new symptoms occur.
